# Patient Record
Sex: Male | Born: 1960 | Race: White | Hispanic: No | Marital: Single | Smoking: Current every day smoker
Health system: Southern US, Community
[De-identification: ages and names within clinical notes are randomized; demographics above are authoritative.]

## PROBLEM LIST (undated history)

## (undated) DIAGNOSIS — E0590 Thyrotoxicosis, unspecified without thyrotoxic crisis or storm: Secondary | ICD-10-CM

## (undated) DIAGNOSIS — I10 Essential (primary) hypertension: Secondary | ICD-10-CM

## (undated) DIAGNOSIS — C801 Malignant (primary) neoplasm, unspecified: Secondary | ICD-10-CM

## (undated) HISTORY — PX: ELBOW ARTHROPLASTY: SHX928

---

## 2010-05-30 ENCOUNTER — Emergency Department: Payer: Self-pay | Admitting: Emergency Medicine

## 2010-09-19 ENCOUNTER — Emergency Department (HOSPITAL_COMMUNITY)
Admission: EM | Admit: 2010-09-19 | Discharge: 2010-09-19 | Disposition: A | Discharge status: Home / Self Care | Payer: Self-pay | Attending: Emergency Medicine | Admitting: Emergency Medicine

## 2010-09-19 DIAGNOSIS — I10 Essential (primary) hypertension: Secondary | ICD-10-CM | POA: Insufficient documentation

## 2010-09-19 DIAGNOSIS — G510 Bell's palsy: Secondary | ICD-10-CM | POA: Insufficient documentation

## 2010-09-19 DIAGNOSIS — M542 Cervicalgia: Secondary | ICD-10-CM | POA: Insufficient documentation

## 2010-09-19 DIAGNOSIS — R209 Unspecified disturbances of skin sensation: Secondary | ICD-10-CM | POA: Insufficient documentation

## 2010-09-19 DIAGNOSIS — R29810 Facial weakness: Secondary | ICD-10-CM | POA: Insufficient documentation

## 2012-01-04 ENCOUNTER — Emergency Department: Payer: Self-pay | Admitting: Emergency Medicine

## 2012-01-19 ENCOUNTER — Emergency Department: Payer: Self-pay | Admitting: Emergency Medicine

## 2012-05-06 ENCOUNTER — Emergency Department: Payer: Self-pay | Admitting: Emergency Medicine

## 2012-06-06 ENCOUNTER — Emergency Department: Payer: Self-pay | Admitting: Internal Medicine

## 2012-06-17 ENCOUNTER — Emergency Department: Payer: Self-pay | Admitting: Emergency Medicine

## 2014-08-24 ENCOUNTER — Encounter: Payer: Self-pay | Admitting: Family Medicine

## 2014-08-24 ENCOUNTER — Ambulatory Visit
Admission: EM | Admit: 2014-08-24 | Discharge: 2014-08-24 | Disposition: A | Discharge status: Home / Self Care | Payer: PRIVATE HEALTH INSURANCE | Attending: Family Medicine | Admitting: Family Medicine

## 2014-08-24 DIAGNOSIS — Z024 Encounter for examination for driving license: Secondary | ICD-10-CM

## 2014-08-24 DIAGNOSIS — Z029 Encounter for administrative examinations, unspecified: Secondary | ICD-10-CM

## 2014-08-24 HISTORY — DX: Essential (primary) hypertension: I10

## 2014-08-24 LAB — DEPT OF TRANSP DIPSTICK, URINE (ARMC ONLY)
Glucose, UA: NEGATIVE mg/dL
Hgb urine dipstick: NEGATIVE
Protein, ur: NEGATIVE mg/dL
SPECIFIC GRAVITY, URINE: 1.01 (ref 1.005–1.030)

## 2014-08-24 NOTE — ED Provider Notes (Signed)
CSN: [id number]     Arrival date & time 08/24/14  0757 History   First MD Initiated Contact with Patient 08/24/14 [phone]     Chief Complaint  Patient presents with  . DOT Physical    (Consider location/radiation/quality/duration/timing/severity/associated sxs/prior Treatment) The history is provided by the patient. A language interpreter was used.    Past Medical History  Diagnosis Date  . Hypertension    Past Surgical History  Procedure Laterality Date  . Elbow arthroplasty     History reviewed. No pertinent family history. History  Substance Use Topics  . Smoking status: Current Every Day Smoker -- 0.25 packs/day    Types: Cigarettes  . Smokeless tobacco: Not on file  . Alcohol Use: 1.8 oz/week    3 Glasses of wine per week    Review of Systems  Allergies  Review of patient's allergies indicates no known allergies.  Home Medications   Prior to Admission medications   Medication Sig Start Date End Date Taking? Authorizing Provider  losartan-hydrochlorothiazide (HYZAAR) 50-12.5 MG per tablet Take 1 tablet by mouth daily.   Yes Historical Provider, MD   BP 178/98 mmHg  Pulse 60  Temp(Src) 98.4 F (36.9 C) (Oral)  Resp 16  Ht '5\' 10"'$  (1.778 m)  Wt 210 lb (95.255 kg)  BMI 30.13 kg/m2  SpO2 98% Physical Exam  Constitutional: He is oriented to person, place, and time. He appears well-developed and well-nourished.  Neck: Neck supple. No tracheal deviation present. No thyromegaly present.  Cardiovascular: Normal rate and normal heart sounds.   Pulmonary/Chest: Effort normal and breath sounds normal.  Abdominal: Soft. He exhibits no distension. There is no tenderness.  Genitourinary: Penis normal.  Musculoskeletal: Normal range of motion. He exhibits no edema.  Neurological: He is alert and oriented to person, place, and time.  Skin: Skin is warm and dry. No rash noted. No erythema.  Psychiatric: He has a normal mood and affect.   Patient's here for DOT examination.  Blood pressure is elevated at this current time. We'll recheck his blood pressure and if still elevated will return in the morning for repeat. He is on medication for his hypertension. One year certificate due to hypertension will be given once his blood pressure satisfactory.  Patient is a smoker want any stop smoking and that PFTs may need to be done at future exams. ED Course  Procedures (including critical care time) Labs Review Labs Reviewed  DEPT OF TRANSP DIPSTICK, URINE([facility])     Imaging Review No results found.   MDM   1. Driver's permit PE (physical examination)        Frederich Cha, MD 08/25/14 [phone]

## 2014-08-24 NOTE — ED Notes (Signed)
Patient is here today for DOT Physical

## 2014-08-24 NOTE — Discharge Instructions (Signed)
hbp hiSmoking Cessation Quitting smoking is important to your health and has many advantages. However, it is not always easy to quit since nicotine is a very addictive drug. Oftentimes, people try 3 times or more before being able to quit. This document explains the best ways for you to prepare to quit smoking. Quitting takes hard work and a lot of effort, but you can do it. ADVANTAGES OF QUITTING SMOKING  You will live longer, feel better, and live better.  Your body will feel the impact of quitting smoking almost immediately.  Within 20 minutes, blood pressure decreases. Your pulse returns to its normal level.  After 8 hours, carbon monoxide levels in the blood return to normal. Your oxygen level increases.  After 24 hours, the chance of having a heart attack starts to decrease. Your breath, hair, and body stop smelling like smoke.  After 48 hours, damaged nerve endings begin to recover. Your sense of taste and smell improve.  After 72 hours, the body is virtually free of nicotine. Your bronchial tubes relax and breathing becomes easier.  After 2 to 12 weeks, lungs can hold more air. Exercise becomes easier and circulation improves.  The risk of having a heart attack, stroke, cancer, or lung disease is greatly reduced.  After 1 year, the risk of coronary heart disease is cut in half.  After 5 years, the risk of stroke falls to the same as a nonsmoker.  After 10 years, the risk of lung cancer is cut in half and the risk of other cancers decreases significantly.  After 15 years, the risk of coronary heart disease drops, usually to the level of a nonsmoker.  If you are pregnant, quitting smoking will improve your chances of having a healthy baby.  The people you live with, especially any children, will be healthier.  You will have extra money to spend on things other than cigarettes. QUESTIONS TO THINK ABOUT BEFORE ATTEMPTING TO QUIT You may want to talk about your answers with  your health care provider.  Why do you want to quit?  If you tried to quit in the past, what helped and what did not?  What will be the most difficult situations for you after you quit? How will you plan to handle them?  Who can help you through the tough times? Your family? Friends? A health care provider?  What pleasures do you get from smoking? What ways can you still get pleasure if you quit? Here are some questions to ask your health care provider:  How can you help me to be successful at quitting?  What medicine do you think would be best for me and how should I take it?  What should I do if I need more help?  What is smoking withdrawal like? How can I get information on withdrawal? GET READY  Set a quit date.  Change your environment by getting rid of all cigarettes, ashtrays, matches, and lighters in your home, car, or work. Do not let people smoke in your home.  Review your past attempts to quit. Think about what worked and what did not. GET SUPPORT AND ENCOURAGEMENT You have a better chance of being successful if you have help. You can get support in many ways.  Tell your family, friends, and coworkers that you are going to quit and need their support. Ask them not to smoke around you.  Get individual, group, or telephone counseling and support. Programs are available at [employer] and health centers.  Call your local health department for information about programs in your area.  Spiritual beliefs and practices may help some smokers quit.  Download a "quit meter" on your computer to keep track of quit statistics, such as how long you have gone without smoking, cigarettes not smoked, and money saved.  Get a self-help book about quitting smoking and staying off tobacco. [place] yourself from urges to smoke. Talk to someone, go for a walk, or occupy your time with a task.  Change your normal routine. Take a different route to  work. Drink tea instead of coffee. Eat breakfast in a different place.  Reduce your stress. Take a hot bath, exercise, or read a book.  Plan something enjoyable to do every day. Reward yourself for not smoking.  Explore interactive web-based programs that specialize in helping you quit. GET MEDICINE AND USE IT CORRECTLY Medicines can help you stop smoking and decrease the urge to smoke. Combining medicine with the above behavioral methods and support can greatly increase your chances of successfully quitting smoking.  Nicotine replacement therapy helps deliver nicotine to your body without the negative effects and risks of smoking. Nicotine replacement therapy includes nicotine gum, lozenges, inhalers, nasal sprays, and skin patches. Some may be available over-the-counter and others require a prescription.  Antidepressant medicine helps people abstain from smoking, but how this works is unknown. This medicine is available by prescription.  Nicotinic receptor partial agonist medicine simulates the effect of nicotine in your brain. This medicine is available by prescription. Ask your health care provider for advice about which medicines to use and how to use them based on your health history. Your health care provider will tell you what side effects to look out for if you choose to be on a medicine or therapy. Carefully read the information on the package. Do not use any other product containing nicotine while using a nicotine replacement product.  RELAPSE OR DIFFICULT SITUATIONS Most relapses occur within the first 3 months after quitting. Do not be discouraged if you start smoking again. Remember, most people try several times before finally quitting. You may have symptoms of withdrawal because your body is used to nicotine. You may crave cigarettes, be irritable, feel very hungry, cough often, get headaches, or have difficulty concentrating. The withdrawal symptoms are only temporary. They are  strongest when you first quit, but they will go away within 10-14 days. To reduce the chances of relapse, try to:  Avoid drinking alcohol. Drinking lowers your chances of successfully quitting.  Reduce the amount of caffeine you consume. Once you quit smoking, the amount of caffeine in your body increases and can give you symptoms, such as a rapid heartbeat, sweating, and anxiety.  Avoid smokers because they can make you want to smoke.  Do not let weight gain distract you. Many smokers will gain weight when they quit, usually less than 10 pounds. Eat a healthy diet and stay active. You can always lose the weight gained after you quit.  Find ways to improve your mood other than smoking. FOR MORE INFORMATION  www.smokefree.gov  Document Released: 03/14/2001 Document Revised: 08/04/2013 Document Reviewed: 06/29/2011 [facility] Patient Information 2015 [place], [place]. This information is not intended to replace advice given to you by your health care provider. Make sure you discuss any questions you have with your health care provider.

## 2014-08-25 ENCOUNTER — Ambulatory Visit
Admission: EM | Admit: 2014-08-25 | Discharge: 2014-08-25 | Disposition: A | Discharge status: Home / Self Care | Payer: PRIVATE HEALTH INSURANCE | Attending: Family Medicine | Admitting: Family Medicine

## 2014-08-25 ENCOUNTER — Encounter: Payer: Self-pay | Admitting: Family Medicine

## 2014-08-25 DIAGNOSIS — Z029 Encounter for administrative examinations, unspecified: Secondary | ICD-10-CM

## 2014-08-25 DIAGNOSIS — Z024 Encounter for examination for driving license: Secondary | ICD-10-CM

## 2014-08-25 NOTE — Discharge Instructions (Signed)
Preventive Care for Adults A healthy lifestyle and preventive care can promote health and wellness. Preventive health guidelines for men include the following key practices:  A routine yearly physical is a good way to check with your health care provider about your health and preventative screening. It is a chance to share any concerns and updates on your health and to receive a thorough exam.  Visit your dentist for a routine exam and preventative care every 6 months. Brush your teeth twice a day and floss once a day. Good oral hygiene prevents tooth decay and gum disease.  The frequency of eye exams is based on your age, health, family medical history, use of contact lenses, and other factors. Follow your health care provider's recommendations for frequency of eye exams.  Eat a healthy diet. Foods such as vegetables, fruits, whole grains, low-fat dairy products, and lean protein foods contain the nutrients you need without too many calories. Decrease your intake of foods high in solid fats, added sugars, and salt. Eat the right amount of calories for you.Get information about a proper diet from your health care provider, if necessary.  Regular physical exercise is one of the most important things you can do for your health. Most adults should get at least 150 minutes of moderate-intensity exercise (any activity that increases your heart rate and causes you to sweat) each week. In addition, most adults need muscle-strengthening exercises on 2 or more days a week.  Maintain a healthy weight. The body mass index (BMI) is a screening tool to identify possible weight problems. It provides an estimate of body fat based on height and weight. Your health care provider can find your BMI and can help you achieve or maintain a healthy weight.For adults 20 years and older:  A BMI below 18.5 is considered underweight.  A BMI of 18.5 to 24.9 is normal.  A BMI of 25 to 29.9 is considered overweight.  A BMI  of 30 and above is considered obese.  Maintain normal blood lipids and cholesterol levels by exercising and minimizing your intake of saturated fat. Eat a balanced diet with plenty of fruit and vegetables. Blood tests for lipids and cholesterol should begin at age 76 and be repeated every 5 years. If your lipid or cholesterol levels are high, you are over 50, or you are at high risk for heart disease, you may need your cholesterol levels checked more frequently.Ongoing high lipid and cholesterol levels should be treated with medicines if diet and exercise are not working.  If you smoke, find out from your health care provider how to quit. If you do not use tobacco, do not start.  Lung cancer screening is recommended for adults aged 48-80 years who are at high risk for developing lung cancer because of a history of smoking. A yearly low-dose CT scan of the lungs is recommended for people who have at least a 30-pack-year history of smoking and are a current smoker or have quit within the past 15 years. A pack year of smoking is smoking an average of 1 pack of cigarettes a day for 1 year (for example: 1 pack a day for 30 years or 2 packs a day for 15 years). Yearly screening should continue until the smoker has stopped smoking for at least 15 years. Yearly screening should be stopped for people who develop a health problem that would prevent them from having lung cancer treatment.  If you choose to drink alcohol, do not have more than  2 drinks per day. One drink is considered to be 12 ounces (355 mL) of beer, 5 ounces (148 mL) of wine, or 1.5 ounces (44 mL) of liquor.  Avoid use of street drugs. Do not share needles with anyone. Ask for help if you need support or instructions about stopping the use of drugs.  High blood pressure causes heart disease and increases the risk of stroke. Your blood pressure should be checked at least every 1-2 years. Ongoing high blood pressure should be treated with  medicines, if weight loss and exercise are not effective.  If you are 45-79 years old, ask your health care provider if you should take aspirin to prevent heart disease.  Diabetes screening involves taking a blood sample to check your fasting blood sugar level. This should be done once every 3 years, after age 45, if you are within normal weight and without risk factors for diabetes. Testing should be considered at a younger age or be carried out more frequently if you are overweight and have at least 1 risk factor for diabetes.  Colorectal cancer can be detected and often prevented. Most routine colorectal cancer screening begins at the age of 50 and continues through age 75. However, your health care provider may recommend screening at an earlier age if you have risk factors for colon cancer. On a yearly basis, your health care provider may provide home test kits to check for hidden blood in the stool. Use of a small camera at the end of a tube to directly examine the colon (sigmoidoscopy or colonoscopy) can detect the earliest forms of colorectal cancer. Talk to your health care provider about this at age 50, when routine screening begins. Direct exam of the colon should be repeated every 5-10 years through age 75, unless early forms of precancerous polyps or small growths are found.  People who are at an increased risk for hepatitis B should be screened for this virus. You are considered at high risk for hepatitis B if:  You were born in a country where hepatitis B occurs often. Talk with your health care provider about which countries are considered high risk.  Your parents were born in a high-risk country and you have not received a shot to protect against hepatitis B (hepatitis B vaccine).  You have HIV or AIDS.  You use needles to inject street drugs.  You live with, or have sex with, someone who has hepatitis B.  You are a man who has sex with other men (MSM).  You get hemodialysis  treatment.  You take certain medicines for conditions such as cancer, organ transplantation, and autoimmune conditions.  Hepatitis C blood testing is recommended for all people born from 1945 through 1965 and any individual with known risks for hepatitis C.  Practice safe sex. Use condoms and avoid high-risk sexual practices to reduce the spread of sexually transmitted infections (STIs). STIs include gonorrhea, chlamydia, syphilis, trichomonas, herpes, HPV, and human immunodeficiency virus (HIV). Herpes, HIV, and HPV are viral illnesses that have no cure. They can result in disability, cancer, and death.  If you are at risk of being infected with HIV, it is recommended that you take a prescription medicine daily to prevent HIV infection. This is called preexposure prophylaxis (PrEP). You are considered at risk if:  You are a man who has sex with other men (MSM) and have other risk factors.  You are a heterosexual man, are sexually active, and are at increased risk for HIV infection.    infection.  You take drugs by injection.  You are sexually active with a partner who has HIV.  Talk with your health care provider about whether you are at high risk of being infected with HIV. If you choose to begin PrEP, you should first be tested for HIV. You should then be tested every 3 months for as long as you are taking PrEP.  A one-time screening for abdominal aortic aneurysm (AAA) and surgical repair of large AAAs by ultrasound are recommended for men ages 72 to 53 years who are current or former smokers.  Healthy men should no longer receive prostate-specific antigen (PSA) blood tests as part of routine cancer screening. Talk with your health care provider about prostate cancer screening.  Testicular cancer screening is not recommended for adult males who have no symptoms. Screening includes self-exam, a health care provider exam, and other screening tests. Consult with your health care provider about any symptoms  you have or any concerns you have about testicular cancer.  Use sunscreen. Apply sunscreen liberally and repeatedly throughout the day. You should seek shade when your shadow is shorter than you. Protect yourself by wearing long sleeves, pants, a wide-brimmed hat, and sunglasses year round, whenever you are outdoors.  Once a month, do a whole-body skin exam, using a mirror to look at the skin on your back. Tell your health care provider about new moles, moles that have irregular borders, moles that are larger than a pencil eraser, or moles that have changed in shape or color.  Stay current with required vaccines (immunizations).  Influenza vaccine. All adults should be immunized every year.  Tetanus, diphtheria, and acellular pertussis (Td, Tdap) vaccine. An adult who has not previously received Tdap or who does not know his vaccine status should receive 1 dose of Tdap. This initial dose should be followed by tetanus and diphtheria toxoids (Td) booster doses every 10 years. Adults with an unknown or incomplete history of completing a 3-dose immunization series with Td-containing vaccines should begin or complete a primary immunization series including a Tdap dose. Adults should receive a Td booster every 10 years.  Varicella vaccine. An adult without evidence of immunity to varicella should receive 2 doses or a second dose if he has previously received 1 dose.  Human papillomavirus (HPV) vaccine. Males aged 54-21 years who have not received the vaccine previously should receive the 3-dose series. Males aged 22-26 years may be immunized. Immunization is recommended through the age of 50 years for any male who has sex with males and did not get any or all doses earlier. Immunization is recommended for any person with an immunocompromised condition through the age of 59 years if he did not get any or all doses earlier. During the 3-dose series, the second dose should be obtained 4-8 weeks after the first  dose. The third dose should be obtained 24 weeks after the first dose and 16 weeks after the second dose.  Zoster vaccine. One dose is recommended for adults aged 58 years or older unless certain conditions are present.  Measles, mumps, and rubella (MMR) vaccine. Adults born before 75 generally are considered immune to measles and mumps. Adults born in [birth year] or later should have 1 or more doses of MMR vaccine unless there is a contraindication to the vaccine or there is laboratory evidence of immunity to each of the three diseases. A routine second dose of MMR vaccine should be obtained at least 28 days after the first dose for students  attending postsecondary schools, health care workers, or international travelers. People who received inactivated measles vaccine or an unknown type of measles vaccine during 1963-1967 should receive 2 doses of MMR vaccine. People who received inactivated mumps vaccine or an unknown type of mumps vaccine before 1979 and are at high risk for mumps infection should consider immunization with 2 doses of MMR vaccine. Unvaccinated health care workers born before [birth year] who lack laboratory evidence of measles, mumps, or rubella immunity or laboratory confirmation of disease should consider measles and mumps immunization with 2 doses of MMR vaccine or rubella immunization with 1 dose of MMR vaccine.  Pneumococcal 13-valent conjugate (PCV13) vaccine. When indicated, a person who is uncertain of his immunization history and has no record of immunization should receive the PCV13 vaccine. An adult aged 15 years or older who has certain medical conditions and has not been previously immunized should receive 1 dose of PCV13 vaccine. This PCV13 should be followed with a dose of pneumococcal polysaccharide (PPSV23) vaccine. The PPSV23 vaccine dose should be obtained at least 8 weeks after the dose of PCV13 vaccine. An adult aged 56 years or older who has certain medical conditions and  previously received 1 or more doses of PPSV23 vaccine should receive 1 dose of PCV13. The PCV13 vaccine dose should be obtained 1 or more years after the last PPSV23 vaccine dose.  Pneumococcal polysaccharide (PPSV23) vaccine. When PCV13 is also indicated, PCV13 should be obtained first. All adults aged 37 years and older should be immunized. An adult younger than age 32 years who has certain medical conditions should be immunized. Any person who resides in a nursing home or long-term care facility should be immunized. An adult smoker should be immunized. People with an immunocompromised condition and certain other conditions should receive both PCV13 and PPSV23 vaccines. People with human immunodeficiency virus (HIV) infection should be immunized as soon as possible after diagnosis. Immunization during chemotherapy or radiation therapy should be avoided. Routine use of PPSV23 vaccine is not recommended for American Indians, [place]atives, or people younger than 65 years unless there are medical conditions that require PPSV23 vaccine. When indicated, people who have unknown immunization and have no record of immunization should receive PPSV23 vaccine. One-time revaccination 5 years after the first dose of PPSV23 is recommended for people aged 19-64 years who have chronic kidney failure, nephrotic syndrome, asplenia, or immunocompromised conditions. People who received 1-2 doses of PPSV23 before age 2 years should receive another dose of PPSV23 vaccine at age 18 years or later if at least 5 years have passed since the previous dose. Doses of PPSV23 are not needed for people immunized with PPSV23 at or after age 29 years.  Meningococcal vaccine. Adults with asplenia or persistent complement component deficiencies should receive 2 doses of quadrivalent meningococcal conjugate (MenACWY-D) vaccine. The doses should be obtained at least 2 months apart. Microbiologists working with certain meningococcal bacteria,  [employer] recruits, people at risk during an outbreak, and people who travel to or live in countries with a high rate of meningitis should be immunized. A first-year college student up through age 15 years who is living in a residence hall should receive a dose if he did not receive a dose on or after his 16th birthday. Adults who have certain high-risk conditions should receive one or more doses of vaccine.  Hepatitis A vaccine. Adults who wish to be protected from this disease, have certain high-risk conditions, work with hepatitis A-infected animals, work in hepatitis A research  travel to or work in countries with a high rate of hepatitis A should be immunized. Adults who were previously unvaccinated and who anticipate close contact with an international adoptee during the first 60 days after arrival in the [place] from a country with a high rate of hepatitis A should be immunized.  Hepatitis B vaccine. Adults should be immunized if they wish to be protected from this disease, have certain high-risk conditions, may be exposed to blood or other infectious body fluids, are household contacts or sex partners of hepatitis B positive people, are clients or workers in certain care facilities, or travel to or work in countries with a high rate of hepatitis B.  Haemophilus influenzae type b (Hib) vaccine. A previously unvaccinated person with asplenia or sickle cell disease or having a scheduled splenectomy should receive 1 dose of Hib vaccine. Regardless of previous immunization, a recipient of a hematopoietic stem cell transplant should receive a 3-dose series 6-12 months after his successful transplant. Hib vaccine is not recommended for adults with HIV infection. Preventive Service / Frequency Ages 81 to 7  Blood pressure check.** / Every 1 to 2 years.  Lipid and cholesterol check.** / Every 5 years beginning at age 49.  Hepatitis C blood test.** / For any individual with known risks for  hepatitis C.  Skin self-exam. / Monthly.  Influenza vaccine. / Every year.  Tetanus, diphtheria, and acellular pertussis (Tdap, Td) vaccine.** / Consult your health care provider. 1 dose of Td every 10 years.  Varicella vaccine.** / Consult your health care provider.  HPV vaccine. / 3 doses over 6 months, if 67 or younger.  Measles, mumps, rubella (MMR) vaccine.** / You need at least 1 dose of MMR if you were born in 1957 or later. You may also need a second dose.  Pneumococcal 13-valent conjugate (PCV13) vaccine.** / Consult your health care provider.  Pneumococcal polysaccharide (PPSV23) vaccine.** / 1 to 2 doses if you smoke cigarettes or if you have certain conditions.  Meningococcal vaccine.** / 1 dose if you are age 28 to 74 years and a Market researcher living in a residence hall, or have one of several medical conditions. You may also need additional booster doses.  Hepatitis A vaccine.** / Consult your health care provider.  Hepatitis B vaccine.** / Consult your health care provider.  Haemophilus influenzae type b (Hib) vaccine.** / Consult your health care provider. Ages 35 to 13  Blood pressure check.** / Every 1 to 2 years.  Lipid and cholesterol check.** / Every 5 years beginning at age 12.  Lung cancer screening. / Every year if you are aged 35-80 years and have a 30-pack-year history of smoking and currently smoke or have quit within the past 15 years. Yearly screening is stopped once you have quit smoking for at least 15 years or develop a health problem that would prevent you from having lung cancer treatment.  Fecal occult blood test (FOBT) of stool. / Every year beginning at age 60 and continuing until age 42. You may not have to do this test if you get a colonoscopy every 10 years.  Flexible sigmoidoscopy** or colonoscopy.** / Every 5 years for a flexible sigmoidoscopy or every 10 years for a colonoscopy beginning at age 83 and continuing until age  50.  Hepatitis C blood test.** / For all people born from [birth year] through 1965 and any individual with known risks for hepatitis C.  Skin self-exam. / Monthly.  Influenza vaccine. / Every  year.  Tetanus, diphtheria, and acellular pertussis (Tdap/Td) vaccine.** / Consult your health care provider. 1 dose of Td every 10 years.  Varicella vaccine.** / Consult your health care provider.  Zoster vaccine.** / 1 dose for adults aged 60 years or older.  Measles, mumps, rubella (MMR) vaccine.** / You need at least 1 dose of MMR if you were born in 1957 or later. You may also need a second dose.  Pneumococcal 13-valent conjugate (PCV13) vaccine.** / Consult your health care provider.  Pneumococcal polysaccharide (PPSV23) vaccine.** / 1 to 2 doses if you smoke cigarettes or if you have certain conditions.  Meningococcal vaccine.** / Consult your health care provider.  Hepatitis A vaccine.** / Consult your health care provider.  Hepatitis B vaccine.** / Consult your health care provider.  Haemophilus influenzae type b (Hib) vaccine.** / Consult your health care provider. Ages 65 and over  Blood pressure check.** / Every 1 to 2 years.  Lipid and cholesterol check.**/ Every 5 years beginning at age 20.  Lung cancer screening. / Every year if you are aged 55-80 years and have a 30-pack-year history of smoking and currently smoke or have quit within the past 15 years. Yearly screening is stopped once you have quit smoking for at least 15 years or develop a health problem that would prevent you from having lung cancer treatment.  Fecal occult blood test (FOBT) of stool. / Every year beginning at age 50 and continuing until age 75. You may not have to do this test if you get a colonoscopy every 10 years.  Flexible sigmoidoscopy** or colonoscopy.** / Every 5 years for a flexible sigmoidoscopy or every 10 years for a colonoscopy beginning at age 50 and continuing until age 75.  Hepatitis C blood  test.** / For all people born from 1945 through 1965 and any individual with known risks for hepatitis C.  Abdominal aortic aneurysm (AAA) screening.** / A one-time screening for ages 65 to 75 years who are current or former smokers.  Skin self-exam. / Monthly.  Influenza vaccine. / Every year.  Tetanus, diphtheria, and acellular pertussis (Tdap/Td) vaccine.** / 1 dose of Td every 10 years.  Varicella vaccine.** / Consult your health care provider.  Zoster vaccine.** / 1 dose for adults aged 60 years or older.  Pneumococcal 13-valent conjugate (PCV13) vaccine.** / Consult your health care provider.  Pneumococcal polysaccharide (PPSV23) vaccine.** / 1 dose for all adults aged 65 years and older.  Meningococcal vaccine.** / Consult your health care provider.  Hepatitis A vaccine.** / Consult your health care provider.  Hepatitis B vaccine.** / Consult your health care provider.  Haemophilus influenzae type b (Hib) vaccine.** / Consult your health care provider. **Family history and personal history of risk and conditions may change your health care provider's recommendations. Document Released: 05/16/2001 Document Revised: 03/25/2013 Document Reviewed: 08/15/2010 Exit[employer] Patient Information 2015 [employer]. This information is not intended to replace advice given to you by your health care provider. Make sure you discuss any questions you have with your health care provider.  

## 2014-08-25 NOTE — ED Provider Notes (Addendum)
CSN: [id number]     Arrival date & time 08/25/14  0911 History   First MD Initiated Contact with Patient 08/25/14 [phone]     Chief Complaint  Patient presents with  . Follow-up   (Consider location/radiation/quality/duration/timing/severity/associated sxs/prior Treatment) HPI  Past Medical History  Diagnosis Date  . Hypertension    Past Surgical History  Procedure Laterality Date  . Elbow arthroplasty     History reviewed. No pertinent family history. History  Substance Use Topics  . Smoking status: Current Every Day Smoker -- 0.25 packs/day    Types: Cigarettes  . Smokeless tobacco: Not on file  . 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 Blood pressure 138/88. Patient given a year for his DOT  Review of Systems  Allergies  Review of patient's allergies indicates no known allergies.  Home Medications   Prior to Admission medications   Medication Sig Start Date End Date Taking? Authorizing Provider  losartan-hydrochlorothiazide (HYZAAR) 50-12.5 MG per tablet Take 1 tablet by mouth daily.    Historical Provider, MD   BP 138/88 mmHg  Pulse 57  Temp(Src) 98.2 F (36.8 C) (Oral)  Resp 16  Ht '5\' 11"'$  (1.803 m)  Wt 210 lb (95.255 kg)  BMI 29.30 kg/m2  SpO2 98% Physical Exam  ED Course  Procedures (including critical care time) Labs Review Labs Reviewed - No data to display  Imaging Review No results found.   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

## 2014-08-25 NOTE — ED Notes (Signed)
Patient is here today for a BP recheck to complete the DOT physical.

## 2015-06-04 ENCOUNTER — Emergency Department: Payer: Medicaid Other

## 2015-06-04 ENCOUNTER — Emergency Department
Admission: EM | Admit: 2015-06-04 | Discharge: 2015-06-04 | Disposition: A | Discharge status: Home / Self Care | Payer: Medicaid Other | Attending: Emergency Medicine | Admitting: Emergency Medicine

## 2015-06-04 DIAGNOSIS — M25552 Pain in left hip: Secondary | ICD-10-CM | POA: Diagnosis present

## 2015-06-04 DIAGNOSIS — R918 Other nonspecific abnormal finding of lung field: Secondary | ICD-10-CM | POA: Diagnosis not present

## 2015-06-04 DIAGNOSIS — I10 Essential (primary) hypertension: Secondary | ICD-10-CM | POA: Insufficient documentation

## 2015-06-04 DIAGNOSIS — M899 Disorder of bone, unspecified: Secondary | ICD-10-CM | POA: Diagnosis not present

## 2015-06-04 DIAGNOSIS — E119 Type 2 diabetes mellitus without complications: Secondary | ICD-10-CM | POA: Diagnosis not present

## 2015-06-04 DIAGNOSIS — C414 Malignant neoplasm of pelvic bones, sacrum and coccyx: Secondary | ICD-10-CM | POA: Insufficient documentation

## 2015-06-04 LAB — COMPREHENSIVE METABOLIC PANEL
ALK PHOS: 69 U/L (ref 38–126)
ALT: 24 U/L (ref 17–63)
AST: 18 U/L (ref 15–41)
Albumin: 3.6 g/dL (ref 3.5–5.0)
Anion gap: 7 (ref 5–15)
BUN: 14 mg/dL (ref 6–20)
CHLORIDE: 106 mmol/L (ref 101–111)
CO2: 27 mmol/L (ref 22–32)
CREATININE: 0.77 mg/dL (ref 0.61–1.24)
Calcium: 9.3 mg/dL (ref 8.9–10.3)
GFR calc non Af Amer: >60 mL/min (ref >60)
GLUCOSE: 94 mg/dL (ref 65–99)
Potassium: 4.3 mmol/L (ref 3.5–5.1)
SODIUM: 140 mmol/L (ref 135–145)
Total Bilirubin: 0.6 mg/dL (ref 0.3–1.2)
Total Protein: 7.3 g/dL (ref 6.5–8.1)

## 2015-06-04 LAB — CBC WITH DIFFERENTIAL/PLATELET
BASOS ABS: 0.1 10*3/uL (ref 0–0.1)
BASOS PCT: 1 %
EOS ABS: 0.2 10*3/uL (ref 0–0.7)
EOS PCT: 2 %
HCT: 47.4 % (ref 40.0–52.0)
Hemoglobin: 16.3 g/dL (ref 13.0–18.0)
Lymphocytes Relative: 32 %
Lymphs Abs: 3.4 10*3/uL (ref 1.0–3.6)
MCH: 29.5 pg (ref 26.0–34.0)
MCHC: 34.5 g/dL (ref 32.0–36.0)
MCV: 85.6 fL (ref 80.0–100.0)
MONO ABS: 0.8 10*3/uL (ref 0.2–1.0)
Monocytes Relative: 8 %
Neutro Abs: 6.2 10*3/uL (ref 1.4–6.5)
Neutrophils Relative %: 57 %
PLATELETS: 263 10*3/uL (ref 150–440)
RBC: 5.54 MIL/uL (ref 4.40–5.90)
RDW: 13 % (ref 11.5–14.5)
WBC: 10.7 10*3/uL — AB (ref 3.8–10.6)

## 2015-06-04 MED ORDER — DIAZEPAM 5 MG PO TABS
ORAL_TABLET | ORAL | Status: AC
  Administered 2015-06-04: 5 mg via ORAL
  Filled 2015-06-04: qty 1

## 2015-06-04 MED ORDER — OXYCODONE-ACETAMINOPHEN 7.5-325 MG PO TABS: 1.0000 | ORAL_TABLET | ORAL | Status: DC | PRN

## 2015-06-04 MED ORDER — OXYCODONE-ACETAMINOPHEN 5-325 MG PO TABS
2.0000 | ORAL_TABLET | Freq: Once | ORAL | Status: AC
  Administered 2015-06-04: 1 via ORAL
  Filled 2015-06-04: qty 1

## 2015-06-04 MED ORDER — DIAZEPAM 5 MG PO TABS
5.0000 mg | ORAL_TABLET | Freq: Once | ORAL | Status: AC
  Administered 2015-06-04: 5 mg via ORAL

## 2015-06-04 NOTE — ED Notes (Signed)
Pt sent referred to the ED from [facility] for abnormal left hip xray.. Pt states he has been having pain in the hip for abotu 3 months.

## 2015-06-04 NOTE — ED Provider Notes (Addendum)
[facility] Emergency Department Provider Note     Time seen: ----------------------------------------- 11:39 AM on 06/04/2015 -----------------------------------------    I have reviewed the triage vital signs and the nursing notes.   HISTORY  Chief Complaint Hip Pain    HPI Chris Mason is a 55 y.o. male who presents to ER for left hip pain for the past 2 months. Patient states initially started in his inguinal area and then more laterally on the hip. Patient states recently he's had pain in his hamstring and up into his left buttock. Patient had outpatient x-rays yesterday that showed abnormal and possibly fractured left hip. Patient states he woke this morning taking NSAIDs and feeling completely better. He was able to walk and move about normally.   Past Medical History  Diagnosis Date  . Hypertension     There are no active problems to display for this patient.   Past Surgical History  Procedure Laterality Date  . Elbow arthroplasty      Allergies Review of patient's allergies indicates no known allergies.  Social History Social History  Substance Use Topics  . Smoking status: Current Every Day Smoker -- 0.25 packs/day    Types: Cigarettes  . Smokeless tobacco: None  . Alcohol Use: 1.8 oz/week    3 Glasses of wine per week    Review of Systems Constitutional: Negative for fever. Eyes: Negative for visual changes. ENT: Negative for sore throat. Cardiovascular: Negative for chest pain. Respiratory: Negative for shortness of breath. Gastrointestinal: Negative for abdominal pain, vomiting and diarrhea. Genitourinary: Negative for dysuria. Musculoskeletal: Positive for left hip pain Skin: Negative for rash. Neurological: Negative for headaches, focal weakness or numbness.  10-point ROS otherwise negative.  ____________________________________________   PHYSICAL EXAM:  VITAL SIGNS: ED Triage Vitals  Enc Vitals Group      BP 06/04/15 1107 151/84 mmHg     Pulse Rate 06/04/15 1107 72     Resp 06/04/15 1107 18     Temp 06/04/15 1107 98.3 F (36.8 C)     Temp Source 06/04/15 1107 Oral     SpO2 06/04/15 1107 100 %     Weight 06/04/15 1107 220 lb (99.791 kg)     Height 06/04/15 1107 '5\' 11"'$  (1.803 m)     Head Cir --      Peak Flow --      Pain Score 06/04/15 1107 5     Pain Loc --      Pain Edu? --      Excl. in [place]? --     Constitutional: Alert and oriented. Well appearing and in no distress. Eyes: Conjunctivae are normal. PERRL. Normal extraocular movements. ENT   Head: Normocephalic and atraumatic.   Nose: No congestion/rhinnorhea.   Mouth/Throat: Mucous membranes are moist.   Neck: No stridor. Cardiovascular: Normal rate, regular rhythm. Normal and symmetric distal pulses are present in all extremities. No murmurs, rubs, or gallops. Respiratory: Normal respiratory effort without tachypnea nor retractions. Breath sounds are clear and equal bilaterally. No wheezes/rales/rhonchi. Gastrointestinal: Soft and nontender. No distention. No abdominal bruits.  Musculoskeletal: Nontender with normal range of motion in all extremities. No joint effusions.  No lower extremity tenderness nor edema. Neurologic:  Normal speech and language. No gross focal neurologic deficits are appreciated. Speech is normal. No gait instability. Skin:  Skin is warm, dry and intact. No rash noted. Psychiatric: Mood and affect are normal. Speech and behavior are normal. Patient exhibits appropriate insight and judgment. ____________________________________________  ED  COURSE:  Pertinent labs & imaging results that were available during my care of the patient were reviewed by me and considered in my medical decision making (see chart for details). Patient is in no acute distress, will obtain CT imaging of the left hip. ____________________________________________   Labs Reviewed  CBC WITH DIFFERENTIAL/PLATELET -  Abnormal; Notable for the following:    WBC 10.7 (*)    All other components within normal limits  COMPREHENSIVE METABOLIC PANEL  PROTEIN ELECTROPHORESIS, SERUM  IGG, IGA, IGM  KAPPA/LAMBDA LIGHT CHAINS     RADIOLOGY Images were viewed by me  CT of the left hip IMPRESSION: Large destructive lesion in the left parasymphyseal pubic bone and superior pubic ramus is consistent with metastatic disease or multiple myeloma. Primary lesion is not identified. It is possible there are additional smaller lesions in the left greater trochanter and left femoral head.  IMPRESSION: Macro lobulated RIGHT upper lobe nodular density question pulmonary mass/tumor; CT chest with contrast recommended to evaluate. ____________________________________________  FINAL ASSESSMENT AND PLAN  Left hip pain, destructive bony lesion, lung mass  Plan: Patient with imaging as dictated above. Hip pain was initially felt to be musculoskeletal, distracted lesion was seen on CT of the hip. I have discussed with Dr.Corcoran from oncology who recommended a series of tests which I ordered for him as well as chest x-ray. Will be contacted with those test results and an appointment will be made for him in the next week.   Earleen Newport, MD   Earleen Newport, MD 06/04/15 [place], MD 06/04/15 [phone]

## 2015-06-04 NOTE — ED Notes (Signed)
Pt states that he is getting frustrated and anxious. He was called and told to "drop everything and get to the ED" and he is stressed. Requests something for anxiety. Told pt this nurse will discuss with MD and see what we can do to help.

## 2015-06-04 NOTE — Discharge Instructions (Signed)
Hip Pain Your hip is the joint between your upper legs and your lower pelvis. The bones, cartilage, tendons, and muscles of your hip joint perform a lot of work each day supporting your body weight and allowing you to move around. Hip pain can range from a minor ache to severe pain in one or both of your hips. Pain may be felt on the inside of the hip joint near the groin, or the outside near the buttocks and upper thigh. You may have swelling or stiffness as well.  HOME CARE INSTRUCTIONS   Take medicines only as directed by your health care provider.  Apply ice to the injured area:  Put ice in a plastic bag.  Place a towel between your skin and the bag.  Leave the ice on for 15-20 minutes at a time, 3-4 times a day.  Keep your leg raised (elevated) when possible to lessen swelling.  Avoid activities that cause pain.  Follow specific exercises as directed by your health care provider.  Sleep with a pillow between your legs on your most comfortable side.  Record how often you have hip pain, the location of the pain, and what it feels like. SEEK MEDICAL CARE IF:   You are unable to put weight on your leg.  Your hip is red or swollen or very tender to touch.  Your pain or swelling continues or worsens after 1 week.  You have increasing difficulty walking.  You have a fever. SEEK IMMEDIATE MEDICAL CARE IF:   You have fallen.  You have a sudden increase in pain and swelling in your hip. MAKE SURE YOU:   Understand these instructions.  Will watch your condition.  Will get help right away if you are not doing well or get worse.   This information is not intended to replace advice given to you by your health care provider. Make sure you discuss any questions you have with your health care provider.   Document Released: 09/07/2009 Document Revised: 04/10/2014 Document Reviewed: 11/14/2012 Elsevier Interactive Patient Education 2016 Elsevier [employer].  Musculoskeletal  Pain Musculoskeletal pain is muscle and boney aches and pains. These pains can occur in any part of the body. Your caregiver may treat you without knowing the cause of the pain. They may treat you if blood or urine tests, X-rays, and other tests were normal.  CAUSES There is often not a definite cause or reason for these pains. These pains may be caused by a type of germ (virus). The discomfort may also come from overuse. Overuse includes working out too hard when your body is not fit. Boney aches also come from weather changes. Bone is sensitive to atmospheric pressure changes. HOME CARE INSTRUCTIONS   Ask when your test results will be ready. Make sure you get your test results.  Only take over-the-counter or prescription medicines for pain, discomfort, or fever as directed by your caregiver. If you were given medications for your condition, do not drive, operate machinery or power tools, or sign legal documents for 24 hours. Do not drink alcohol. Do not take sleeping pills or other medications that may interfere with treatment.  Continue all activities unless the activities cause more pain. When the pain lessens, slowly resume normal activities. Gradually increase the intensity and duration of the activities or exercise.  During periods of severe pain, bed rest may be helpful. Lay or sit in any position that is comfortable.  Putting ice on the injured area.  Put ice in a  bag.  Place a towel between your skin and the bag.  Leave the ice on for 15 to 20 minutes, 3 to 4 times a day.  Follow up with your caregiver for continued problems and no reason can be found for the pain. If the pain becomes worse or does not go away, it may be necessary to repeat tests or do additional testing. Your caregiver may need to look further for a possible cause. SEEK IMMEDIATE MEDICAL CARE IF:  You have pain that is getting worse and is not relieved by medications.  You develop chest pain that is associated  with shortness or breath, sweating, feeling sick to your stomach (nauseous), or throw up (vomit).  Your pain becomes localized to the abdomen.  You develop any new symptoms that seem different or that concern you. MAKE SURE YOU:   Understand these instructions.  Will watch your condition.  Will get help right away if you are not doing well or get worse.   This information is not intended to replace advice given to you by your health care provider. Make sure you discuss any questions you have with your health care provider.   Document Released: 03/20/2005 Document Revised: 06/12/2011 Document Reviewed: 11/22/2012 Elsevier Interactive Patient Education [employer].

## 2015-06-04 NOTE — ED Notes (Signed)
Pt discharged home after verbalizing understanding of discharge instructions; nad noted. 

## 2015-06-07 ENCOUNTER — Inpatient Hospital Stay: Payer: Medicaid Other | Attending: Hematology and Oncology | Admitting: Hematology and Oncology

## 2015-06-07 VITALS — BP 165/106 | HR 87 | Temp 99.4°F | Resp 18 | Ht 71.0 in

## 2015-06-07 DIAGNOSIS — C3411 Malignant neoplasm of upper lobe, right bronchus or lung: Secondary | ICD-10-CM | POA: Insufficient documentation

## 2015-06-07 DIAGNOSIS — C414 Malignant neoplasm of pelvic bones, sacrum and coccyx: Secondary | ICD-10-CM

## 2015-06-07 DIAGNOSIS — I10 Essential (primary) hypertension: Secondary | ICD-10-CM | POA: Insufficient documentation

## 2015-06-07 DIAGNOSIS — R918 Other nonspecific abnormal finding of lung field: Secondary | ICD-10-CM | POA: Insufficient documentation

## 2015-06-07 DIAGNOSIS — Z79899 Other long term (current) drug therapy: Secondary | ICD-10-CM | POA: Diagnosis not present

## 2015-06-07 DIAGNOSIS — F17210 Nicotine dependence, cigarettes, uncomplicated: Secondary | ICD-10-CM | POA: Insufficient documentation

## 2015-06-07 DIAGNOSIS — F419 Anxiety disorder, unspecified: Secondary | ICD-10-CM | POA: Insufficient documentation

## 2015-06-07 DIAGNOSIS — C787 Secondary malignant neoplasm of liver and intrahepatic bile duct: Secondary | ICD-10-CM | POA: Insufficient documentation

## 2015-06-07 DIAGNOSIS — M25552 Pain in left hip: Secondary | ICD-10-CM | POA: Insufficient documentation

## 2015-06-07 DIAGNOSIS — Z5111 Encounter for antineoplastic chemotherapy: Secondary | ICD-10-CM | POA: Insufficient documentation

## 2015-06-07 DIAGNOSIS — C7951 Secondary malignant neoplasm of bone: Secondary | ICD-10-CM | POA: Insufficient documentation

## 2015-06-07 LAB — KAPPA/LAMBDA LIGHT CHAINS
Kappa free light chain: 19.78 mg/L — ABNORMAL HIGH (ref 3.30–19.40)
Kappa, lambda light chain ratio: 1.07 (ref 0.26–1.65)
Lambda free light chains: 18.43 mg/L (ref 5.71–26.30)

## 2015-06-07 LAB — PROTEIN ELECTROPHORESIS, SERUM
A/G RATIO SPE: 1.1 (ref 0.7–1.7)
Albumin ELP: 3.9 g/dL (ref 2.9–4.4)
Alpha-1-Globulin: 0.3 g/dL (ref 0.0–0.4)
Alpha-2-Globulin: 0.9 g/dL (ref 0.4–1.0)
BETA GLOBULIN: 1.2 g/dL (ref 0.7–1.3)
GLOBULIN, TOTAL: 3.5 g/dL (ref 2.2–3.9)
Gamma Globulin: 1 g/dL (ref 0.4–1.8)
Total Protein ELP: 7.4 g/dL (ref 6.0–8.5)

## 2015-06-07 LAB — IGG, IGA, IGM
IGM, SERUM: 94 mg/dL (ref 20–172)
IgA: 355 mg/dL (ref 90–386)
IgG (Immunoglobin G), Serum: 923 mg/dL (ref 700–1600)

## 2015-06-07 NOTE — Progress Notes (Signed)
[facility] day:  06/07/2015  Chief Complaint: JAKHAI FANT is a 55 y.o. male with a right lung mass and lytic lesion in the pubic bone who is referred by Dr. Lenise Arena for assessment and management.  HPI:   The patient has a 33 pack year smoking history.  He has a history of pneumonia x 2.  The last episode was 3 years ago.  He presented to [facility] on 06/04/2015 with 2 month history of left hip pain.  Pain initially started in the inguinal area and then moved more laterally on the hip.  Plain films of the left hip at the [facility] on 06/03/2015 revealed an abnormality.  CT scan without contrast of the left hip on 06/04/2015 revealed a 3.3 x 2.5 cm destructive lesion in the left parasymphyseal pubic bone and superior pubic ramus.  Surrounding sift tissues demonstrated a low-attenuation soft tissue component of the lesion.  There was subtle lucency of the left greater trochanter and a small focus of cortical destruction along the anterior aspect of the greater trochanter.  There was also lucency in the periphery of the left femoral head without cortical disruption.  CXR on 06/04/2015 revealed a 3 x 1.8 x 2.3 cm nodular density in the right upper lobe.  There were no acute osseous findings.  Labs on 06/04/2015 revealed a a hematocrit of 47.4, hemoglobin 16.3, MCV 85.6, platelets 263,000, WBC 10,700 with an ANC of 6200.  Comprehensive metabolic panel revealed a normal creatinine, calcium, protein, and albumen.  SPEP was normal.  Free light chains and antibody levels are pending.  Symptomatically, he feels good except for left hip pain.  He denies any respiratory symptoms.   Past Medical History  Diagnosis Date  . Hypertension     Past Surgical History  Procedure Laterality Date  . Elbow arthroplasty      No family history on file.  Social History:  reports that he has been smoking Cigarettes.  He has been smoking about 0.25 packs  per day. He does not have any smokeless tobacco history on file. He reports that he drinks about 1.8 oz of alcohol per week. He reports that he does not use illicit drugs.  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  The patient is accompanied by his girlfriend today.  Allergies: No Known Allergies  Current Medications: Current Outpatient Prescriptions  Medication Sig Dispense Refill  . losartan-hydrochlorothiazide (HYZAAR) 50-12.5 MG per tablet Take 1 tablet by mouth daily.    Marland Kitchen oxyCODONE-acetaminophen (PERCOCET) 7.5-325 MG tablet Take 1 tablet by mouth every 4 (four) hours as needed for severe pain. 20 tablet 0   No current facility-administered medications for this visit.   Review of Systems:  GENERAL:  Feels healthy.  Active.  No fevers, sweats or weight loss. PERFORMANCE STATUS (ECOG):  1 HEENT:  Transient ear and teeth pain x 2 4 months ago.  No visual changes, runny nose, sore throat, mouth sores or tenderness. Lungs: Little cough in the morning, sometimes at night.  No shortness of breath or cough.  No hemoptysis. Cardiac:  No chest pain, palpitations, orthopnea, or PND. GI:  No nausea, vomiting, diarrhea, constipation, melena or hematochezia.  No prior colonoscopy. GU:  No urgency, frequency, dysuria, or hematuria. Musculoskeletal:  Left hip pain.  Transient left rib pain.  No back pain.  No joint pain.  No muscle tenderness.  Extremities:  No pain or swelling. Skin:  Small lesion on right arm, busted.  Transient circular whelp on back.  No rashes or skin changes. Neuro:  Few headaches.  No numbness or weakness, balance or coordination issues. Endocrine:  No diabetes, thyroid issues, hot flashes or night sweats. Psych:  Anxiety.  No mood changes or depression. Pain:  No focal pain. Review of systems:  All other systems reviewed and found to be negative.  Physical Exam: Blood pressure 165/106, pulse 87,  temperature 99.4 F (37.4 C), temperature source Tympanic, resp. rate 18, height '5\' 11"'$  (1.803 m). GENERAL:  Well developed, well nourished, sitting comfortably in the exam room in no acute distress. MENTAL STATUS:  Alert and oriented to person, place and time. HEAD:  Short brown hair with graying goatee.  Normocephalic, atraumatic, face symmetric, no Cushingoid features. EYES: Pupils equal round and reactive to light and accomodation.  No conjunctivitis or scleral icterus. ENT:  Oropharynx clear without lesion.  Tongue normal. Mucous membranes moist.  RESPIRATORY:  Clear to auscultation without rales, wheezes or rhonchi. CARDIOVASCULAR:  Regular rate and rhythm without murmur, rub or gallop. ABDOMEN:  Soft, non-tender, with active bowel sounds, and no hepatosplenomegaly.  No masses. SKIN:  No rashes, ulcers or lesions. EXTREMITIES: Pain on deep palpation anteromedial hip.  Decreased range of motion in the hip secondary to pain.  No edema, no skin discoloration or tenderness.  No palpable cords. LYMPH NODES: No palpable cervical, supraclavicular, axillary or inguinal adenopathy  NEUROLOGICAL:  Alert & oriented, cranial nerves II-XII intact; motor strength 5/5 throughout; sensation intact; finger to nose and RAM normal; able to walk heel to toe; negative Rhomberg.  Walks with a limp. PSYCH:  Appropriate.    No visits with results within 3 Day(s) from this visit. Latest known visit with results is:  Admission on 06/04/2015, Discharged on 06/04/2015  Component Date Value Ref Range Status  . WBC 06/04/2015 10.7* 3.8 - 10.6 K/uL Final  . RBC 06/04/2015 5.54  4.40 - 5.90 MIL/uL Final  . Hemoglobin 06/04/2015 16.3  13.0 - 18.0 g/dL Final  . HCT 06/04/2015 47.4  40.0 - 52.0 % Final  . MCV 06/04/2015 85.6  80.0 - 100.0 fL Final  . MCH 06/04/2015 29.5  26.0 - 34.0 pg Final  . MCHC 06/04/2015 34.5  32.0 - 36.0 g/dL Final  . RDW 06/04/2015 13.0  11.5 - 14.5 % Final  . Platelets 06/04/2015 263  150  - 440 K/uL Final  . Neutrophils Relative % 06/04/2015 57   Final  . Neutro Abs 06/04/2015 6.2  1.4 - 6.5 K/uL Final  . Lymphocytes Relative 06/04/2015 32   Final  . Lymphs Abs 06/04/2015 3.4  1.0 - 3.6 K/uL Final  . Monocytes Relative 06/04/2015 8   Final  . Monocytes Absolute 06/04/2015 0.8  0.2 - 1.0 K/uL Final  . Eosinophils Relative 06/04/2015 2   Final  . Eosinophils Absolute 06/04/2015 0.2  0 - 0.7 K/uL Final  . Basophils Relative 06/04/2015 1   Final  . Basophils Absolute 06/04/2015 0.1  0 - 0.1 K/uL Final  . Sodium 06/04/2015 140  135 - 145 mmol/L Final  . Potassium 06/04/2015 4.3  3.5 - 5.1 mmol/L Final  . Chloride 06/04/2015 106  101 - 111 mmol/L Final  . CO2 06/04/2015 27  22 - 32 mmol/L Final  . Glucose, Bld 06/04/2015 94  65 - 99 mg/dL Final  . BUN 06/04/2015 14  6 - 20 mg/dL Final  .  Creatinine, Ser 06/04/2015 0.77  0.61 - 1.24 mg/dL Final  . Calcium 06/04/2015 9.3  8.9 - 10.3 mg/dL Final  . Total Protein 06/04/2015 7.3  6.5 - 8.1 g/dL Final  . Albumin 06/04/2015 3.6  3.5 - 5.0 g/dL Final  . AST 06/04/2015 18  15 - 41 U/L Final  . ALT 06/04/2015 24  17 - 63 U/L Final  . Alkaline Phosphatase 06/04/2015 69  38 - 126 U/L Final  . Total Bilirubin 06/04/2015 0.6  0.3 - 1.2 mg/dL Final  . GFR calc non Af Amer 06/04/2015 >60  >60 mL/min Final  . GFR calc Af Amer 06/04/2015 >60  >60 mL/min Final   Comment: (NOTE) The eGFR has been calculated using the CKD EPI equation. This calculation has not been validated in all clinical situations. eGFR's persistently <60 mL/min signify possible Chronic Kidney Disease.   . Anion gap 06/04/2015 7  5 - 15 Final  . Total Protein ELP 06/04/2015 7.4  6.0 - 8.5 g/dL Final  . Albumin ELP 06/04/2015 3.9  2.9 - 4.4 g/dL Final  . Alpha-1-Globulin 06/04/2015 0.3  0.0 - 0.4 g/dL Final  . Alpha-2-Globulin 06/04/2015 0.9  0.4 - 1.0 g/dL Final  . Beta Globulin 06/04/2015 1.2  0.7 - 1.3 g/dL Final  . Gamma Globulin 06/04/2015 1.0  0.4 - 1.8 g/dL  Final  . M-Spike, % 06/04/2015 Not Observed  Not Observed g/dL Final  . SPE Interp. 06/04/2015 Comment   Final   Comment: (NOTE) The SPE pattern appears essentially unremarkable. Evidence of monoclonal protein is not apparent. Performed At: [facility] [place], [place] [id number] Lindon Romp MD [id number]   . Comment 06/04/2015 Comment   Final   Comment: (NOTE) Protein electrophoresis scan will follow via computer, mail, or courier delivery.   Marland Kitchen GLOBULIN, TOTAL 06/04/2015 3.5  2.2 - 3.9 g/dL Corrected  . A/G Ratio 06/04/2015 1.1  0.7 - 1.7 Corrected  . IgG (Immunoglobin G), Serum 06/04/2015 923  700 - 1600 mg/dL Final  . IgA 06/04/2015 355  90 - 386 mg/dL Final  . IgM, Serum 06/04/2015 94  20 - 172 mg/dL Final   Comment: (NOTE) Performed At: [facility] [place], [place] [id number] Lindon Romp MD [id number]   . Kappa free light chain 06/04/2015 19.78* 3.30 - 19.40 mg/L Final  . Lamda free light chains 06/04/2015 18.43  5.71 - 26.30 mg/L Final  . Kappa, lamda light chain ratio 06/04/2015 1.07  0.26 - 1.65 Final   Comment: (NOTE) Performed At: [facility] [place], [place] [id number] Lindon Romp MD [id number]     Assessment:  VALEN GILLISON is a 55 y.o. male with probable metastatic lung cancer.  He presented with a 2 month history of left hip pain.  CXR revealed a nodular density in the right upper lobe.  CT scan of the left hip on 06/04/2015 revealed a 3.3 x 2.5 cm destructive lesion in the left parasymphyseal pubic bone and superior pubic ramus.  Surrounding sift tissues demonstrated a low-attenuation soft tissue component of the lesion.  There was subtle lucency of the left greater trochanter and a small focus of cortical destruction along the anterior aspect of the greater trochanter.  There was also lucency in the periphery of the left femoral head without cortical  disruption.  CXR on 06/04/2015 revealed a 3 x 1.8 x 2.3 cm nodular density in the right upper lobe.  There were no  acute osseous findings.  Symptomatically, he feels well except for left hip pain.  Exam reveals tenderness and decreased range of motion in the hip secondary to pain.  Neurologic exam is normal.  Plan: 1. Review imaging studies and abnormalities in chest and hip/pubic bone.  Discuss possible metastatic lung cancer.  Discuss available labs.  No evidence of multiple myeloma.  Discuss plan for PET scan and biopsy. 2. PET scan:  Right lung mass and destructive change in pubic ramus and left femoral head. 3. Review imaging with radiology re: biopsy site. 4. Follow-up pending labs from ER. 5. RTC after PET scan for MD assessment.   Lequita Asal, MD  06/07/2015, 2:59 PM

## 2015-06-09 ENCOUNTER — Ambulatory Visit
Admission: RE | Admit: 2015-06-09 | Discharge: 2015-06-09 | Disposition: A | Discharge status: Home / Self Care | Payer: Medicaid Other | Source: Ambulatory Visit | Attending: Hematology and Oncology | Admitting: Hematology and Oncology

## 2015-06-09 DIAGNOSIS — C7951 Secondary malignant neoplasm of bone: Secondary | ICD-10-CM | POA: Diagnosis not present

## 2015-06-09 DIAGNOSIS — I700 Atherosclerosis of aorta: Secondary | ICD-10-CM | POA: Diagnosis not present

## 2015-06-09 DIAGNOSIS — C414 Malignant neoplasm of pelvic bones, sacrum and coccyx: Secondary | ICD-10-CM | POA: Diagnosis not present

## 2015-06-09 DIAGNOSIS — K760 Fatty (change of) liver, not elsewhere classified: Secondary | ICD-10-CM | POA: Insufficient documentation

## 2015-06-09 DIAGNOSIS — R918 Other nonspecific abnormal finding of lung field: Secondary | ICD-10-CM

## 2015-06-09 DIAGNOSIS — C787 Secondary malignant neoplasm of liver and intrahepatic bile duct: Secondary | ICD-10-CM | POA: Insufficient documentation

## 2015-06-09 LAB — GLUCOSE, CAPILLARY: Glucose-Capillary: 122 mg/dL — ABNORMAL HIGH (ref 65–99)

## 2015-06-09 MED ORDER — FLUDEOXYGLUCOSE F - 18 (FDG) INJECTION
12.7200 | Freq: Once | INTRAVENOUS | Status: AC | PRN
  Administered 2015-06-09: 12.72 via INTRAVENOUS

## 2015-06-11 ENCOUNTER — Encounter: Payer: Self-pay | Admitting: Hematology and Oncology

## 2015-06-11 ENCOUNTER — Other Ambulatory Visit: Payer: Self-pay | Admitting: Hematology and Oncology

## 2015-06-11 ENCOUNTER — Other Ambulatory Visit: Payer: Self-pay | Admitting: Radiology

## 2015-06-11 ENCOUNTER — Other Ambulatory Visit: Payer: Self-pay | Admitting: General Surgery

## 2015-06-11 ENCOUNTER — Other Ambulatory Visit: Payer: Self-pay

## 2015-06-11 ENCOUNTER — Inpatient Hospital Stay (HOSPITAL_BASED_OUTPATIENT_CLINIC_OR_DEPARTMENT_OTHER): Payer: Medicaid Other | Admitting: Hematology and Oncology

## 2015-06-11 VITALS — BP 167/119 | HR 73 | Temp 98.0°F | Resp 18 | Ht 71.0 in | Wt 217.8 lb

## 2015-06-11 DIAGNOSIS — M25552 Pain in left hip: Secondary | ICD-10-CM

## 2015-06-11 DIAGNOSIS — R918 Other nonspecific abnormal finding of lung field: Secondary | ICD-10-CM

## 2015-06-11 DIAGNOSIS — C787 Secondary malignant neoplasm of liver and intrahepatic bile duct: Secondary | ICD-10-CM

## 2015-06-11 DIAGNOSIS — F17210 Nicotine dependence, cigarettes, uncomplicated: Secondary | ICD-10-CM

## 2015-06-11 DIAGNOSIS — Z5111 Encounter for antineoplastic chemotherapy: Secondary | ICD-10-CM | POA: Diagnosis not present

## 2015-06-11 DIAGNOSIS — Z79899 Other long term (current) drug therapy: Secondary | ICD-10-CM

## 2015-06-11 DIAGNOSIS — C414 Malignant neoplasm of pelvic bones, sacrum and coccyx: Secondary | ICD-10-CM

## 2015-06-11 DIAGNOSIS — F419 Anxiety disorder, unspecified: Secondary | ICD-10-CM

## 2015-06-11 DIAGNOSIS — I10 Essential (primary) hypertension: Secondary | ICD-10-CM

## 2015-06-11 MED ORDER — LORAZEPAM 0.5 MG PO TABS: 0.5000 mg | ORAL_TABLET | Freq: Four times a day (QID) | ORAL | Status: DC | PRN

## 2015-06-14 ENCOUNTER — Other Ambulatory Visit: Payer: Self-pay | Admitting: Hematology and Oncology

## 2015-06-14 ENCOUNTER — Ambulatory Visit
Admission: RE | Admit: 2015-06-14 | Discharge: 2015-06-14 | Disposition: A | Discharge status: Home / Self Care | Payer: Medicaid Other | Source: Ambulatory Visit | Attending: Hematology and Oncology | Admitting: Hematology and Oncology

## 2015-06-14 ENCOUNTER — Encounter: Payer: Self-pay | Admitting: Hematology and Oncology

## 2015-06-14 DIAGNOSIS — G893 Neoplasm related pain (acute) (chronic): Secondary | ICD-10-CM

## 2015-06-14 DIAGNOSIS — C787 Secondary malignant neoplasm of liver and intrahepatic bile duct: Secondary | ICD-10-CM | POA: Insufficient documentation

## 2015-06-14 DIAGNOSIS — C3490 Malignant neoplasm of unspecified part of unspecified bronchus or lung: Secondary | ICD-10-CM | POA: Insufficient documentation

## 2015-06-14 DIAGNOSIS — C414 Malignant neoplasm of pelvic bones, sacrum and coccyx: Secondary | ICD-10-CM

## 2015-06-14 LAB — CBC
HCT: 47.1 % (ref 40.0–52.0)
HEMOGLOBIN: 16.2 g/dL (ref 13.0–18.0)
MCH: 29.5 pg (ref 26.0–34.0)
MCHC: 34.4 g/dL (ref 32.0–36.0)
MCV: 85.7 fL (ref 80.0–100.0)
Platelets: 320 10*3/uL (ref 150–440)
RBC: 5.5 MIL/uL (ref 4.40–5.90)
RDW: 13.4 % (ref 11.5–14.5)
WBC: 11.3 10*3/uL — ABNORMAL HIGH (ref 3.8–10.6)

## 2015-06-14 LAB — APTT: aPTT: 28 seconds (ref 24–36)

## 2015-06-14 LAB — PROTIME-INR
INR: 0.97
Prothrombin Time: 13.1 seconds (ref 11.4–15.0)

## 2015-06-14 MED ORDER — SODIUM CHLORIDE 0.9 % IV SOLN
INTRAVENOUS | Status: DC
Start: 2015-06-14 — End: 2015-06-15
  Administered 2015-06-14: 10:00:00 via INTRAVENOUS

## 2015-06-14 MED ORDER — FENTANYL CITRATE (PF) 100 MCG/2ML IJ SOLN
INTRAMUSCULAR | Status: AC
  Filled 2015-06-14: qty 4

## 2015-06-14 MED ORDER — HYDROCODONE-ACETAMINOPHEN 5-325 MG PO TABS
1.0000 | ORAL_TABLET | ORAL | Status: DC | PRN
Start: 2015-06-14 — End: 2015-06-15
  Filled 2015-06-14: qty 2

## 2015-06-14 MED ORDER — MIDAZOLAM HCL 5 MG/5ML IJ SOLN
INTRAMUSCULAR | Status: AC | PRN
  Administered 2015-06-14 (×2): 1 mg via INTRAVENOUS
  Administered 2015-06-14: 0.5 mg via INTRAVENOUS

## 2015-06-14 MED ORDER — MIDAZOLAM HCL 5 MG/5ML IJ SOLN
INTRAMUSCULAR | Status: AC
  Filled 2015-06-14: qty 5

## 2015-06-14 MED ORDER — OXYCODONE-ACETAMINOPHEN 7.5-325 MG PO TABS: 1.0000 | ORAL_TABLET | ORAL | Status: DC | PRN

## 2015-06-14 MED ORDER — FENTANYL CITRATE (PF) 100 MCG/2ML IJ SOLN
INTRAMUSCULAR | Status: AC | PRN
  Administered 2015-06-14: 50 ug via INTRAVENOUS
  Administered 2015-06-14: 25 ug via INTRAVENOUS

## 2015-06-14 MED ORDER — DIAZEPAM 5 MG PO TABS
5.0000 mg | ORAL_TABLET | Freq: Once | ORAL | Status: AC
  Administered 2015-06-14: 5 mg via ORAL

## 2015-06-14 MED ORDER — SODIUM CHLORIDE FLUSH 0.9 % IV SOLN
INTRAVENOUS | Status: AC
  Filled 2015-06-14: qty 10

## 2015-06-14 NOTE — Procedures (Signed)
Discussed procedure and risks with patient and family. Informed consent obtained. Will perform US-guided liver biopsy.

## 2015-06-14 NOTE — Procedures (Signed)
Under US guidance, biopsy of right hepatic lobe mass was performed. Without complication.

## 2015-06-14 NOTE — Progress Notes (Signed)
[facility] day:  06/11/2015  Chief Complaint: Chris Mason is a 55 y.o. male with a right lung mass and lytic lesion in the pubic bone who is seen for review of interval PET scan and discussion regarding direction of therapy.  HPI:   The patient was last seen in the medical oncology clinic on 06/07/2015.  At that time, he was seen for initial consultation regarding a right lung mass and left pubic bone destructive lesion.  He had a 33 pack year smoking history.  Findings were worrisome for metastatic lung cancer.  Labs from the emergency room had ruled out multiple myeloma.  He was scheduled for PET scan.  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 There was a dominant 2.0 x 1.8 cm right upper lobe pulmonary nodule (SUV 8.9) with thoracic nodal, hepatic, and multifocal osseous metastasis.  Examples within the left pubic bone at a SUV of 11.3, left transverse process at T7 (SUV of 8.9), C4 vertebral body (SUV of 5.7), lateral eighth left rib (SUV of 6.3.).  There was a 3.8 cm right hepatic lobe hypo-attenuating mass (SUV 8.4).  Symptomatically, he feels good except for left hip pain.  He has a limp because of discomfort with walking.  He denies any respiratory symptoms.  He has been quite anxious since his evaluation in the emergency room.  Past Medical History  Diagnosis Date  . Hypertension     Past Surgical History  Procedure Laterality Date  . Elbow arthroplasty      No family history on file.  Social History:  reports that he has been smoking Cigarettes.  He has been smoking about 0.25 packs per day. He does not have any smokeless tobacco history on file. He reports that he drinks about 1.8 oz of alcohol per week. He reports that he does not use illicit drugs.  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  It [birth year] today.  The patient is accompanied by several family members (wife, girlfriend, son, sister, and mother).  Allergies: No Known Allergies  Current Medications: Current Outpatient Prescriptions  Medication Sig Dispense Refill  . losartan-hydrochlorothiazide (HYZAAR) 50-12.5 MG per tablet Take 1 tablet by mouth daily.    Marland Kitchen oxyCODONE-acetaminophen (PERCOCET) 7.5-325 MG tablet Take 1 tablet by mouth every 4 (four) hours as needed for severe pain. 20 tablet 0  . LORazepam (ATIVAN) 0.5 MG tablet Take 1 tablet (0.5 mg total) by mouth every 6 (six) hours as needed for anxiety. 30 tablet 0   No current facility-administered medications for this visit.   Review of Systems:  GENERAL:  Feels healthy.  No fevers, sweats or weight loss. PERFORMANCE STATUS (ECOG):  1 HEENT:  No visual changes, runny nose, sore throat, mouth sores or tenderness. Lungs: Little cough in the morning, sometimes at night.  No shortness of breath or cough.  No hemoptysis. Cardiac:  No chest pain, palpitations, orthopnea, or PND. GI:  No nausea, vomiting, diarrhea, constipation, melena or hematochezia.  No prior colonoscopy. GU:  No urgency, frequency, dysuria, or hematuria. Musculoskeletal:  Left hip pain.  Left rib pain.  Back pain.  No joint pain.  No muscle tenderness. Extremities:  No pain or swelling. Skin:  No rashes, skin changes, or ulcers. Neuro:  Few headaches.  No numbness or weakness, balance or coordination issues.  Endocrine:  No diabetes, thyroid issues, hot flashes or night sweats. Psych:  Anxiety.  No mood changes or depression. Pain:  No focal pain. Review of systems:  All other systems reviewed and found to be negative.  Physical Exam: Blood pressure 167/119, pulse 73, temperature 98 F (36.7 C), temperature source Tympanic, resp. rate 18, height '5\' 11"'$  (1.803 m), weight 217 lb 13 oz (98.8 kg). GENERAL:  Well developed, well nourished, moving about in the exam room in no  acute distress.  He is anxious. MENTAL STATUS:  Alert and oriented to person, place and time. HEAD:  Short brown hair with graying goatee.  Normocephalic, atraumatic, face symmetric, no Cushingoid features. EYES: No conjunctivitis or scleral icterus. ABDOMEN:  Tender overlying left pubic bone.  Soft and no hepatosplenomegaly.  No masses. SKIN:  No rashes, ulcers or lesions. EXTREMITIES:No edema, no skin discoloration or tenderness.  No palpable cords. NEUROLOGICAL:  Appropriate.  Walks with a limp. PSYCH:  Appropriate.    Hospital Outpatient Visit on 06/09/2015  Component Date Value Ref Range Status  . Glucose-Capillary 06/09/2015 122* 65 - 99 mg/dL Final    Assessment:  GILMAR BUA is a 56 y.o. male with probable metastatic lung cancer.  He presented with a 2 month history of left hip pain.  CXR revealed a nodular density in the right upper lobe.  CT scan of the left hip on 06/04/2015 revealed a 3.3 x 2.5 cm destructive lesion in the left parasymphyseal pubic bone and superior pubic ramus.  Surrounding sift tissues demonstrated a low-attenuation soft tissue component of the lesion.  There was subtle lucency of the left greater trochanter and a small focus of cortical destruction along the anterior aspect of the greater trochanter.  There was also lucency in the periphery of the left femoral head without cortical disruption.  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 There was a dominant 2.0 x 1.8 cm right upper lobe pulmonary nodule (SUV 8.9) with thoracic nodal, hepatic, and multifocal osseous metastasis.  Examples within the left pubic bone at a SUV of 11.3, left transverse process at T7 (SUV of 8.9), C4 vertebral body (SUV of 5.7), lateral eighth left rib (SUV of 6.3.).  There was a 3.8 cm right hepatic lobe hypo-attenuating mass (SUV 8.4).  Symptomatically, he feels well except for left hip pain.  Exam  reveals tenderness and decreased range of motion in the hip secondary to pain.  Neurologic exam is normal.  He is quite anxious.  Plan: 1.  Review PET scan in detail.  Images projected onto monitor in room. 2.  Discuss concern for metastatic lung cancer.  Discuss plan for ultrasound guided liver biopsy.  Discuss plan for head MRI to ensure no evidence of metastasis.  Discuss treatment of metastatic lung cancer (small cell versus non-small cell).  Discuss targeted therapy for lung cancer with driver mutations.  Discuss reconvneing next week after biopsy back for treatment plan. 3.  Discuss hypertension.  Patient encouraged to go to emergency room for management. 4.  Schedule ultrasound guided liver biopsy. 5.  Schedule head MRI with and without contrast. 6.  Rx:  Ativan 0.5 mg po q 6 hrs prn anxiety.  Patient not to operate heavy machinery or drive while taking medication. 7.  RTC on 06/17/2015 for review of biopsy and head MRI and discussion regarding treatment.  Approximately 45-50 minutes spent with patient and family discussing probable diagnosis and treatment.  Multiple  questions were asked and answered.   Lequita Asal, MD  06/11/2015

## 2015-06-15 ENCOUNTER — Ambulatory Visit (HOSPITAL_COMMUNITY)
Admission: RE | Admit: 2015-06-15 | Discharge: 2015-06-15 | Disposition: A | Discharge status: Home / Self Care | Payer: Medicaid Other | Source: Ambulatory Visit | Attending: Hematology and Oncology | Admitting: Hematology and Oncology

## 2015-06-15 ENCOUNTER — Other Ambulatory Visit: Payer: Self-pay | Admitting: Hematology and Oncology

## 2015-06-15 DIAGNOSIS — R9089 Other abnormal findings on diagnostic imaging of central nervous system: Secondary | ICD-10-CM | POA: Diagnosis not present

## 2015-06-15 DIAGNOSIS — C787 Secondary malignant neoplasm of liver and intrahepatic bile duct: Secondary | ICD-10-CM

## 2015-06-15 DIAGNOSIS — R918 Other nonspecific abnormal finding of lung field: Secondary | ICD-10-CM | POA: Insufficient documentation

## 2015-06-15 DIAGNOSIS — C414 Malignant neoplasm of pelvic bones, sacrum and coccyx: Secondary | ICD-10-CM | POA: Insufficient documentation

## 2015-06-15 MED ORDER — GADOBENATE DIMEGLUMINE 529 MG/ML IV SOLN
20.0000 mL | Freq: Once | INTRAVENOUS | Status: AC | PRN
  Administered 2015-06-15: 20 mL via INTRAVENOUS

## 2015-06-15 MED ORDER — GADOBENATE DIMEGLUMINE 529 MG/ML IV SOLN: 20.0000 mL | Freq: Once | INTRAVENOUS | Status: DC | PRN

## 2015-06-16 ENCOUNTER — Other Ambulatory Visit: Payer: Self-pay | Admitting: Hematology and Oncology

## 2015-06-16 DIAGNOSIS — C3491 Malignant neoplasm of unspecified part of right bronchus or lung: Secondary | ICD-10-CM

## 2015-06-18 ENCOUNTER — Other Ambulatory Visit: Payer: Self-pay

## 2015-06-18 ENCOUNTER — Inpatient Hospital Stay (HOSPITAL_BASED_OUTPATIENT_CLINIC_OR_DEPARTMENT_OTHER): Payer: Medicaid Other | Admitting: Hematology and Oncology

## 2015-06-18 ENCOUNTER — Ambulatory Visit: Payer: Self-pay

## 2015-06-18 VITALS — BP 182/99 | HR 85 | Temp 98.2°F | Resp 18 | Ht 70.5 in | Wt 217.8 lb

## 2015-06-18 DIAGNOSIS — C787 Secondary malignant neoplasm of liver and intrahepatic bile duct: Secondary | ICD-10-CM

## 2015-06-18 DIAGNOSIS — Z79899 Other long term (current) drug therapy: Secondary | ICD-10-CM

## 2015-06-18 DIAGNOSIS — C414 Malignant neoplasm of pelvic bones, sacrum and coccyx: Secondary | ICD-10-CM

## 2015-06-18 DIAGNOSIS — F17210 Nicotine dependence, cigarettes, uncomplicated: Secondary | ICD-10-CM

## 2015-06-18 DIAGNOSIS — C7951 Secondary malignant neoplasm of bone: Secondary | ICD-10-CM

## 2015-06-18 DIAGNOSIS — F419 Anxiety disorder, unspecified: Secondary | ICD-10-CM

## 2015-06-18 DIAGNOSIS — Z5111 Encounter for antineoplastic chemotherapy: Secondary | ICD-10-CM | POA: Diagnosis not present

## 2015-06-18 DIAGNOSIS — C3491 Malignant neoplasm of unspecified part of right bronchus or lung: Secondary | ICD-10-CM

## 2015-06-18 DIAGNOSIS — C3411 Malignant neoplasm of upper lobe, right bronchus or lung: Secondary | ICD-10-CM

## 2015-06-18 DIAGNOSIS — G893 Neoplasm related pain (acute) (chronic): Secondary | ICD-10-CM

## 2015-06-18 DIAGNOSIS — M25552 Pain in left hip: Secondary | ICD-10-CM

## 2015-06-18 MED ORDER — LORAZEPAM 0.5 MG PO TABS: 0.5000 mg | ORAL_TABLET | Freq: Four times a day (QID) | ORAL | Status: DC | PRN

## 2015-06-18 MED ORDER — OXYCODONE HCL 5 MG PO CAPS: ORAL_CAPSULE | ORAL | Status: DC

## 2015-06-21 ENCOUNTER — Encounter: Payer: Self-pay | Admitting: Hematology and Oncology

## 2015-06-21 ENCOUNTER — Other Ambulatory Visit: Payer: Self-pay | Admitting: *Deleted

## 2015-06-21 ENCOUNTER — Other Ambulatory Visit: Payer: Self-pay

## 2015-06-21 DIAGNOSIS — C3491 Malignant neoplasm of unspecified part of right bronchus or lung: Secondary | ICD-10-CM

## 2015-06-21 MED ORDER — OXYCODONE HCL 5 MG PO TABS: 5.0000 mg | ORAL_TABLET | ORAL | Status: DC | PRN

## 2015-06-21 MED ORDER — ONDANSETRON HCL 8 MG PO TABS: 8.0000 mg | ORAL_TABLET | Freq: Two times a day (BID) | ORAL | Status: DC | PRN

## 2015-06-21 NOTE — Progress Notes (Signed)
[facility] day:  06/18/2015  Chief Complaint: Chris Mason is a 55 y.o. male with a right lung mass and lytic lesion in the pubic bone who is seen for review of interval head MRI and liver biopsy and discussion regarding direction of therapy.  HPI:   The patient was last seen in the medical oncology clinic on 06/11/2015.  At that time, PET scan was reviewed.  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 There was a dominant 2.0 x 1.8 cm right upper lobe pulmonary nodule (SUV 8.9) with thoracic nodal, hepatic, and multifocal osseous metastasis.  Symptomatically, he feels good except for left hip pain.  He had a limp because of discomfort with walking.  He denied any respiratory symptoms.    At last visit, we discussed obtaining a liver biopsy.  We also discussed obtaining a head MRI. We discussed follow-up in the emergency room or with a primary care provider regarding his hypertension.  He was given a prescription for Ativan for his anxiety.  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  The carcinoma was positive for CD56 and negative for p40.   Head MRI on 06/16/2015 revealed no evidence for metastatic disease.  There was nonspecific scattered subcortical white matter changes which can be seen in the setting of chronic microvascular ischemia, a demyelinating process such as multiple sclerosis, vasculitis, complicated migraine headaches, or as the sequelae of a prior infectious or inflammatory process.  Symptomatically, he feels fine except for left hip pain.  He is taking oxycodone 7.5 mg/acetaminophen 325 mg every 4-6 hours as needed for pain.   Past Medical History  Diagnosis Date  . Hypertension     Past Surgical History  Procedure Laterality Date  . Elbow arthroplasty      No family history on file.  Social History:   reports that he has been smoking Cigarettes.  He has a 8.75 pack-year smoking history. He does not have any smokeless tobacco history on file. He reports that he drinks about 1.8 oz of alcohol per week. He reports that he does not use illicit drugs.  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  The patient is accompanied by several family members (wife, girlfriend, and mother).  Allergies: No Known Allergies  Current Medications: Current Outpatient Prescriptions  Medication Sig Dispense Refill  . losartan-hydrochlorothiazide (HYZAAR) 50-12.5 MG per tablet Take 1 tablet by mouth daily.    Marland Kitchen oxyCODONE-acetaminophen (PERCOCET) 7.5-325 MG tablet Take 1 tablet by mouth every 4 (four) hours as needed for severe pain. 20 tablet 0  . LORazepam (ATIVAN) 0.5 MG tablet Take 1 tablet (0.5 mg total) by mouth every 6 (six) hours as needed for anxiety. 30 tablet 0  . oxycodone (OXY-IR) 5 MG capsule 1-2 tablets every 4 to 6 hours as needed for pain 40 capsule 0   No current facility-administered medications for this visit.   Review of Systems:  GENERAL:  Feels fine.  No fevers, sweats or weight loss. PERFORMANCE STATUS (ECOG):  1 HEENT:  No visual changes, runny nose, sore throat, mouth sores or tenderness. Lungs: Little cough in the morning, sometimes at night.  No shortness of breath or cough.  No hemoptysis. Cardiac:  No chest pain, palpitations, orthopnea, or PND. GI:  No nausea, vomiting, diarrhea, constipation, melena or hematochezia.  No prior colonoscopy. GU:  No urgency, frequency, dysuria, or hematuria. Musculoskeletal:  Left hip pain.  Left rib pain.  Back pain.  No joint pain.  No muscle tenderness. Extremities:  No pain or swelling. Skin:  No rashes, skin changes, or ulcers. Neuro:  No headaches, numbness or weakness, balance or coordination issues. Endocrine:  No diabetes, thyroid issues, hot flashes or night sweats. Psych:   Anxiety.  No mood changes or depression. Pain:  No focal pain. Review of systems:  All other systems reviewed and found to be negative.  Physical Exam: Blood pressure 182/99, pulse 85, temperature 98.2 F (36.8 C), resp. rate 18, height 5' 10.5" (1.791 m), weight 217 lb 13 oz (98.8 kg). GENERAL:  Well developed, well nourished, moving about in the exam room in no acute distress.  He is anxious. MENTAL STATUS:  Alert and oriented to person, place and time. HEAD:  Short brown hair with graying goatee.  Normocephalic, atraumatic, face symmetric, no Cushingoid features. EYES: No conjunctivitis or scleral icterus. NEUROLOGICAL:  Appropriate.  Walks with a slight limp. PSYCH:  Appropriate.    No visits with results within 3 Day(s) from this visit. Latest known visit with results is:  Hospital Outpatient Visit on 06/14/2015  Component Date Value Ref Range Status  . aPTT 06/14/2015 28  24 - 36 seconds Final  . WBC 06/14/2015 11.3* 3.8 - 10.6 K/uL Final  . RBC 06/14/2015 5.50  4.40 - 5.90 MIL/uL Final  . Hemoglobin 06/14/2015 16.2  13.0 - 18.0 g/dL Final  . HCT 06/14/2015 47.1  40.0 - 52.0 % Final  . MCV 06/14/2015 85.7  80.0 - 100.0 fL Final  . MCH 06/14/2015 29.5  26.0 - 34.0 pg Final  . MCHC 06/14/2015 34.4  32.0 - 36.0 g/dL Final  . RDW 06/14/2015 13.4  11.5 - 14.5 % Final  . Platelets 06/14/2015 320  150 - 440 K/uL Final  . Prothrombin Time 06/14/2015 13.1  11.4 - 15.0 seconds Final  . INR 06/14/2015 0.97   Final  . SURGICAL PATHOLOGY 06/14/2015    Final                   Value:Surgical Pathology CASE: [id number] PATIENT: Chris Mason Surgical Pathology Report     SPECIMEN SUBMITTED: A. Liver mass, right, metastases, bx  CLINICAL HISTORY: Right lung mass, left pubic bone destructive lesion, and 3.8 cm hepatic mass  in smoker  PRE-OPERATIVE DIAGNOSIS: Metastases  POST-OPERATIVE DIAGNOSIS: Same as pre-op     DIAGNOSIS: A. LIVER MASS, RIGHT; ULTRASOUND GUIDED  BIOPSY: - METASTATIC SMALL CELL CARCINOMA.  Comment: The carcinoma is positive for CD56 and negative for p40. This pattern of immunoreactivity supports the above diagnosis. Stain controls worked appropriately. Preliminary diagnosis called to Dr. Mike Gip on 06/15/15.   GROSS DESCRIPTION: A. Labeled: Liver mass  Tissue fragment(s): 4  Size: 0.7-1.5 x 0.1 cm  Description: Pink red cylindrically shaped tissue fragments, inked black  Entirely submitted in on 4 cassette(s).     Final Diagnosis performed by Quay Burow, MD.  Electronically signed 06/16/2015 4:2                         3:32PM    The electronic signature indicates that the named Attending Pathologist has evaluated the specimen  Technical component performed at [facility], [address], [place], [place] [address] Lab: [phone] Dir: Darrick Penna. Evette Doffing, MD  Professional component performed at [facility], [facility], [address]  [place], [place], [place] [address] Lab: [phone] Dir: Dellia Nims. Reuel Derby, MD      Assessment:  HARJIT LEIDER is a 55 y.o. male with extensive stage small cell lung cancer.  He presented with a 2 month history of left hip pain.  CXR revealed a nodular density in the right upper lobe.  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    CT scan of the left hip on 06/04/2015 revealed a 3.3 x 2.5 cm destructive lesion in the left parasymphyseal pubic bone and superior pubic ramus.  Surrounding sift tissues demonstrated a low-attenuation soft tissue component of the lesion.  There was subtle lucency of the left greater trochanter and a small focus of cortical destruction along the anterior aspect of the greater trochanter.  There was also lucency in the periphery of the left femoral head without cortical disruption.  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  There was a dominant 2.0 x 1.8 cm right upper lobe pulmonary nodule (SUV 8.9) with thoracic nodal, hepatic, and multifocal osseous metastasis.  Examples within the left pubic bone at a SUV of 11.3, left transverse process at T7 (SUV of 8.9), C4 vertebral body (SUV of 5.7), lateral eighth left rib (SUV of 6.3.).  There was a 3.8 cm right hepatic lobe hypo-attenuating mass (SUV 8.4).  Head MRI on 06/16/2015 revealed no evidence for metastatic disease.   Symptomatically, he feels well except for left hip pain.  Exam reveals tenderness and decreased range of motion in the hip secondary to pain.  Neurologic exam is normal.  He remains anxious.  Plan: 1.  Review head MRI and liver biopsy. 2.  Discuss diagnosis of extensive small stage lung cancer.  Discuss prognosis and palliative chemotherapy.  Discuss tumor is extremely chemotherapy sensitive, but will recur.  Discuss plan for 4-6 cycles of carboplatin and etoposide (given day 1-3 every 21 days) with Neulasta support.  Discuss side effects of chemotherapy including myelosuppression, nausea, vomiting, hair loss, electrolytes wasting (potassium and magnesium), hypotension and small risk of leukemia (with etoposide).  Discuss risk of infection when counts are low (neutropenia).  Discuss consideration of port-a-cath placement.  Discuss nursing to assess veins for peripheral chemotherapy administration.   Patient wishes 1st cycle via PIV.  Discuss reassessment every 2 cycles with imaging (PET after 4th cycle).  Discuss possible prophylactic cranial irradiation (PCI) if excellent response to chemotherapy. 3.  Discuss addition of Xgeva monthly given metastatic bone lesions to prevent bone related events. 4.  Discuss pain management.  Discuss keeping pain dairy with plan to switch to long acting pain medication (Oxycontin or Fentanyl patch) based on use of prn pain medications. 5.  Schedule chemotherapy class. 6.  Discuss follow-up with Elease Etienne, social worker,  as patient self pay. 7.  Rx:  Oxycodone 5-10 mg po q 4-6 hrs prn pain.  8.  Rx:  Ativan 0.5 mg po q 6 hrs prn anxiety. 9.  RTC on 06/23/2015 for MD assessment, labs (CBC with diff, CMP, Mg, CEA), and cycle #1 carboplatin and etoposide with On-Pro Neulasta support +/- Xgeva.  Approximately 45 minutes spent with patient and family discussing his diagnosis, treatment plan, and management of symptoms.  Multiple questions were asked and answered.   Lequita Asal, MD  06/18/2015

## 2015-06-22 ENCOUNTER — Inpatient Hospital Stay: Payer: Medicaid Other

## 2015-06-22 NOTE — Patient Instructions (Signed)
Etoposide, VP-16 injection  What is this medicine?  ETOPOSIDE, VP-16 (e toe POE side) is a chemotherapy drug. It is used to treat testicular cancer, lung cancer, and other cancers.  This medicine may be used for other purposes; ask your health care provider or pharmacist if you have questions.  What should I tell my health care provider before I take this medicine?  They need to know if you have any of these conditions:  -infection  -kidney disease  -low blood counts, like low white cell, platelet, or red cell counts  -an unusual or allergic reaction to etoposide, other chemotherapeutic agents, other medicines, foods, dyes, or preservatives  -pregnant or trying to get pregnant  -breast-feeding  How should I use this medicine?  This medicine is for infusion into a vein. It is administered in a hospital or clinic by a specially trained health care professional.  Talk to your pediatrician regarding the use of this medicine in children. Special care may be needed.  Overdosage: If you think you have taken too much of this medicine contact a poison control center or emergency room at once.  NOTE: This medicine is only for you. Do not share this medicine with others.  What if I miss a dose?  It is important not to miss your dose. Call your doctor or health care professional if you are unable to keep an appointment.  What may interact with this medicine?  -aspirin  -certain medications for seizures like carbamazepine, phenobarbital, phenytoin, valproic acid  -cyclosporine  -levamisole  -warfarin  This list may not describe all possible interactions. Give your health care provider a list of all the medicines, herbs, non-prescription drugs, or dietary supplements you use. Also tell them if you smoke, drink alcohol, or use illegal drugs. Some items may interact with your medicine.  What should I watch for while using this medicine?  Visit your doctor for checks on your progress. This drug may make you feel generally unwell.  This is not uncommon, as chemotherapy can affect healthy cells as well as cancer cells. Report any side effects. Continue your course of treatment even though you feel ill unless your doctor tells you to stop.  In some cases, you may be given additional medicines to help with side effects. Follow all directions for their use.  Call your doctor or health care professional for advice if you get a fever, chills or sore throat, or other symptoms of a cold or flu. Do not treat yourself. This drug decreases your body's ability to fight infections. Try to avoid being around people who are sick.  This medicine may increase your risk to bruise or bleed. Call your doctor or health care professional if you notice any unusual bleeding.  Be careful brushing and flossing your teeth or using a toothpick because you may get an infection or bleed more easily. If you have any dental work done, tell your dentist you are receiving this medicine.  Avoid taking products that contain aspirin, acetaminophen, ibuprofen, naproxen, or ketoprofen unless instructed by your doctor. These medicines may hide a fever.  Do not become pregnant while taking this medicine or for at least 6 months after stopping it. Women should inform their doctor if they wish to become pregnant or think they might be pregnant. Women of child-bearing potential will need to have a negative pregnancy test before starting this medicine. There is a potential for serious side effects to an unborn child. Talk to your health care professional or   pharmacist for more information. Do not breast-feed an infant while taking this medicine.  Men must use a latex condom during sexual contact with a woman while taking this medicine and for at least 4 months after stopping it. A latex condom is needed even if you have had a vasectomy. Contact your doctor right away if your partner becomes pregnant. Do not donate sperm while taking this medicine and for at least 4 months after you stop  taking this medicine. Men should inform their doctors if they wish to father a child. This medicine may lower sperm counts.  What side effects may I notice from receiving this medicine?  Side effects that you should report to your doctor or health care professional as soon as possible:  -allergic reactions like skin rash, itching or hives, swelling of the face, lips, or tongue  -low blood counts - this medicine may decrease the number of white blood cells, red blood cells and platelets. You may be at increased risk for infections and bleeding.  -signs of infection - fever or chills, cough, sore throat, pain or difficulty passing urine  -signs of decreased platelets or bleeding - bruising, pinpoint red spots on the skin, black, tarry stools, blood in the urine  -signs of decreased red blood cells - unusually weak or tired, fainting spells, lightheadedness  -breathing problems  -changes in vision  -mouth or throat sores or ulcers  -pain, redness, swelling or irritation at the injection site  -pain, tingling, numbness in the hands or feet  -redness, blistering, peeling or loosening of the skin, including inside the mouth  -seizures  -vomiting  Side effects that usually do not require medical attention (report to your doctor or health care professional if they continue or are bothersome):  -diarrhea  -hair loss  -loss of appetite  -nausea  -stomach pain  This list may not describe all possible side effects. Call your doctor for medical advice about side effects. You may report side effects to FDA at 1-800-FDA-1088.  Where should I keep my medicine?  This drug is given in a hospital or clinic and will not be stored at home.  NOTE: This sheet is a summary. It may not cover all possible information. If you have questions about this medicine, talk to your doctor, pharmacist, or health care provider.      2016, Elsevier/[employer] Standard. (2013-11-13 12:32:50)  Carboplatin injection  What is this medicine?  CARBOPLATIN (KAR boe  pla tin) is a chemotherapy drug. It targets fast dividing cells, like cancer cells, and causes these cells to die. This medicine is used to treat ovarian cancer and many other cancers.  This medicine may be used for other purposes; ask your health care provider or pharmacist if you have questions.  What should I tell my health care provider before I take this medicine?  They need to know if you have any of these conditions:  -blood disorders  -hearing problems  -kidney disease  -recent or ongoing radiation therapy  -an unusual or allergic reaction to carboplatin, cisplatin, other chemotherapy, other medicines, foods, dyes, or preservatives  -pregnant or trying to get pregnant  -breast-feeding  How should I use this medicine?  This drug is usually given as an infusion into a vein. It is administered in a hospital or clinic by a specially trained health care professional.  Talk to your pediatrician regarding the use of this medicine in children. Special care may be needed.  Overdosage: If you think you have taken   too much of this medicine contact a poison control center or emergency room at once.  NOTE: This medicine is only for you. Do not share this medicine with others.  What if I miss a dose?  It is important not to miss a dose. Call your doctor or health care professional if you are unable to keep an appointment.  What may interact with this medicine?  -medicines for seizures  -medicines to increase blood counts like filgrastim, pegfilgrastim, sargramostim  -some antibiotics like amikacin, gentamicin, neomycin, streptomycin, tobramycin  -vaccines  Talk to your doctor or health care professional before taking any of these medicines:  -acetaminophen  -aspirin  -ibuprofen  -ketoprofen  -naproxen  This list may not describe all possible interactions. Give your health care provider a list of all the medicines, herbs, non-prescription drugs, or dietary supplements you use. Also tell them if you smoke, drink alcohol, or  use illegal drugs. Some items may interact with your medicine.  What should I watch for while using this medicine?  Your condition will be monitored carefully while you are receiving this medicine. You will need important blood work done while you are taking this medicine.  This drug may make you feel generally unwell. This is not uncommon, as chemotherapy can affect healthy cells as well as cancer cells. Report any side effects. Continue your course of treatment even though you feel ill unless your doctor tells you to stop.  In some cases, you may be given additional medicines to help with side effects. Follow all directions for their use.  Call your doctor or health care professional for advice if you get a fever, chills or sore throat, or other symptoms of a cold or flu. Do not treat yourself. This drug decreases your body's ability to fight infections. Try to avoid being around people who are sick.  This medicine may increase your risk to bruise or bleed. Call your doctor or health care professional if you notice any unusual bleeding.  Be careful brushing and flossing your teeth or using a toothpick because you may get an infection or bleed more easily. If you have any dental work done, tell your dentist you are receiving this medicine.  Avoid taking products that contain aspirin, acetaminophen, ibuprofen, naproxen, or ketoprofen unless instructed by your doctor. These medicines may hide a fever.  Do not become pregnant while taking this medicine. Women should inform their doctor if they wish to become pregnant or think they might be pregnant. There is a potential for serious side effects to an unborn child. Talk to your health care professional or pharmacist for more information. Do not breast-feed an infant while taking this medicine.  What side effects may I notice from receiving this medicine?  Side effects that you should report to your doctor or health care professional as soon as possible:  -allergic  reactions like skin rash, itching or hives, swelling of the face, lips, or tongue  -signs of infection - fever or chills, cough, sore throat, pain or difficulty passing urine  -signs of decreased platelets or bleeding - bruising, pinpoint red spots on the skin, black, tarry stools, nosebleeds  -signs of decreased red blood cells - unusually weak or tired, fainting spells, lightheadedness  -breathing problems  -changes in hearing  -changes in vision  -chest pain  -high blood pressure  -low blood counts - This drug may decrease the number of white blood cells, red blood cells and platelets. You may be at increased risk for   hospital or clinic and will not be stored at home. NOTE: This sheet is a summary. It may not cover all possible information. If you have questions about this medicine, talk to your doctor, pharmacist, or health care provider.    2016, [employer] Standard. (2007-06-25 14:38:05) Denosumab injection What is this medicine? DENOSUMAB (den oh sue mab) slows bone breakdown. Prolia is used to treat osteoporosis in women after menopause and in men. Delton See is used to prevent bone fractures and other bone problems caused by cancer bone metastases. Delton See is also used to treat giant cell tumor of the  bone. This medicine may be used for other purposes; ask your health care provider or pharmacist if you have questions. What should I tell my health care provider before I take this medicine? They need to know if you have any of these conditions: -dental disease -eczema -infection or history of infections -kidney disease or on dialysis -low blood calcium or vitamin D -malabsorption syndrome -scheduled to have surgery or tooth extraction -taking medicine that contains denosumab -thyroid or parathyroid disease -an unusual reaction to denosumab, other medicines, foods, dyes, or preservatives -pregnant or trying to get pregnant -breast-feeding How should I use this medicine? This medicine is for injection under the skin. It is given by a health care professional in a hospital or clinic setting. If you are getting Prolia, a special MedGuide will be given to you by the pharmacist with each prescription and refill. Be sure to read this information carefully each time. For Prolia, talk to your pediatrician regarding the use of this medicine in children. Special care may be needed. For Delton See, talk to your pediatrician regarding the use of this medicine in children. While this drug may be prescribed for children as young as 13 years for selected conditions, precautions do apply. Overdosage: If you think you have taken too much of this medicine contact a poison control center or emergency room at once. NOTE: This medicine is only for you. Do not share this medicine with others. What if I miss a dose? It is important not to miss your dose. Call your doctor or health care professional if you are unable to keep an appointment. What may interact with this medicine? Do not take this medicine with any of the following medications: -other medicines containing denosumab This medicine may also interact with the following medications: -medicines that suppress the immune system -medicines that treat  cancer -steroid medicines like prednisone or cortisone This list may not describe all possible interactions. Give your health care provider a list of all the medicines, herbs, non-prescription drugs, or dietary supplements you use. Also tell them if you smoke, drink alcohol, or use illegal drugs. Some items may interact with your medicine. What should I watch for while using this medicine? Visit your doctor or health care professional for regular checks on your progress. Your doctor or health care professional may order blood tests and other tests to see how you are doing. Call your doctor or health care professional if you get a cold or other infection while receiving this medicine. Do not treat yourself. This medicine may decrease your body's ability to fight infection. You should make sure you get enough calcium and vitamin D while you are taking this medicine, unless your doctor tells you not to. Discuss the foods you eat and the vitamins you take with your health care professional. See your dentist regularly. Brush and floss your teeth as directed. Before you have any dental work done,  tell your dentist you are receiving this medicine. Do not become pregnant while taking this medicine or for 5 months after stopping it. Women should inform their doctor if they wish to become pregnant or think they might be pregnant. There is a potential for serious side effects to an unborn child. Talk to your health care professional or pharmacist for more information. What side effects may I notice from receiving this medicine? Side effects that you should report to your doctor or health care professional as soon as possible: -allergic reactions like skin rash, itching or hives, swelling of the face, lips, or tongue -breathing problems -chest pain -fast, irregular heartbeat -feeling faint or lightheaded, falls -fever, chills, or any other sign of infection -muscle spasms, tightening, or twitches -numbness or  tingling -skin blisters or bumps, or is dry, peels, or red -slow healing or unexplained pain in the mouth or jaw -unusual bleeding or bruising Side effects that usually do not require medical attention (Report these to your doctor or health care professional if they continue or are bothersome.): -muscle pain -stomach upset, gas This list may not describe all possible side effects. Call your doctor for medical advice about side effects. You may report side effects to FDA at 1-800-FDA-1088. Where should I keep my medicine? This medicine is only given in a clinic, doctor's office, or other health care setting and will not be stored at home. NOTE: This sheet is a summary. It may not cover all possible information. If you have questions about this medicine, talk to your doctor, pharmacist, or health care provider.    2016, [employer] Standard. (2011-09-18 12:37:47)

## 2015-06-23 ENCOUNTER — Inpatient Hospital Stay: Payer: Medicaid Other

## 2015-06-23 ENCOUNTER — Encounter: Payer: Self-pay | Admitting: *Deleted

## 2015-06-23 ENCOUNTER — Inpatient Hospital Stay (HOSPITAL_BASED_OUTPATIENT_CLINIC_OR_DEPARTMENT_OTHER): Payer: Medicaid Other | Admitting: Oncology

## 2015-06-23 VITALS — BP 117/70 | HR 67 | Resp 18

## 2015-06-23 VITALS — BP 123/74 | HR 82 | Temp 97.7°F | Resp 18 | Ht 70.5 in | Wt 214.1 lb

## 2015-06-23 DIAGNOSIS — F419 Anxiety disorder, unspecified: Secondary | ICD-10-CM

## 2015-06-23 DIAGNOSIS — I10 Essential (primary) hypertension: Secondary | ICD-10-CM

## 2015-06-23 DIAGNOSIS — C787 Secondary malignant neoplasm of liver and intrahepatic bile duct: Secondary | ICD-10-CM

## 2015-06-23 DIAGNOSIS — C3491 Malignant neoplasm of unspecified part of right bronchus or lung: Secondary | ICD-10-CM

## 2015-06-23 DIAGNOSIS — C7951 Secondary malignant neoplasm of bone: Secondary | ICD-10-CM

## 2015-06-23 DIAGNOSIS — C414 Malignant neoplasm of pelvic bones, sacrum and coccyx: Secondary | ICD-10-CM

## 2015-06-23 DIAGNOSIS — R918 Other nonspecific abnormal finding of lung field: Secondary | ICD-10-CM

## 2015-06-23 DIAGNOSIS — C3411 Malignant neoplasm of upper lobe, right bronchus or lung: Secondary | ICD-10-CM

## 2015-06-23 DIAGNOSIS — Z5111 Encounter for antineoplastic chemotherapy: Secondary | ICD-10-CM | POA: Diagnosis not present

## 2015-06-23 DIAGNOSIS — Z79899 Other long term (current) drug therapy: Secondary | ICD-10-CM

## 2015-06-23 DIAGNOSIS — M25552 Pain in left hip: Secondary | ICD-10-CM

## 2015-06-23 DIAGNOSIS — F17210 Nicotine dependence, cigarettes, uncomplicated: Secondary | ICD-10-CM

## 2015-06-23 LAB — CBC WITH DIFFERENTIAL/PLATELET
Basophils Absolute: 0.1 10*3/uL (ref 0–0.1)
Basophils Relative: 1 %
Eosinophils Absolute: 0.2 10*3/uL (ref 0–0.7)
Eosinophils Relative: 2 %
HCT: 46 % (ref 40.0–52.0)
Hemoglobin: 16.1 g/dL (ref 13.0–18.0)
Lymphocytes Relative: 23 %
Lymphs Abs: 2.6 10*3/uL (ref 1.0–3.6)
MCH: 30.1 pg (ref 26.0–34.0)
MCHC: 34.9 g/dL (ref 32.0–36.0)
MCV: 86.2 fL (ref 80.0–100.0)
Monocytes Absolute: 1.1 10*3/uL — ABNORMAL HIGH (ref 0.2–1.0)
Monocytes Relative: 10 %
Neutro Abs: 7.5 10*3/uL — ABNORMAL HIGH (ref 1.4–6.5)
Neutrophils Relative %: 64 %
Platelets: 298 10*3/uL (ref 150–440)
RBC: 5.33 MIL/uL (ref 4.40–5.90)
RDW: 13 % (ref 11.5–14.5)
WBC: 11.6 10*3/uL — ABNORMAL HIGH (ref 3.8–10.6)

## 2015-06-23 LAB — MAGNESIUM: Magnesium: 2.1 mg/dL (ref 1.7–2.4)

## 2015-06-23 LAB — COMPREHENSIVE METABOLIC PANEL
ALT: 27 U/L (ref 17–63)
AST: 21 U/L (ref 15–41)
Albumin: 4.1 g/dL (ref 3.5–5.0)
Alkaline Phosphatase: 81 U/L (ref 38–126)
Anion gap: 7 (ref 5–15)
BUN: 21 mg/dL — ABNORMAL HIGH (ref 6–20)
CO2: 25 mmol/L (ref 22–32)
Calcium: 9.3 mg/dL (ref 8.9–10.3)
Chloride: 103 mmol/L (ref 101–111)
Creatinine, Ser: 0.92 mg/dL (ref 0.61–1.24)
GFR calc Af Amer: >60 mL/min (ref >60)
GFR calc non Af Amer: >60 mL/min (ref >60)
Glucose, Bld: 121 mg/dL — ABNORMAL HIGH (ref 65–99)
Potassium: 3.7 mmol/L (ref 3.5–5.1)
Sodium: 135 mmol/L (ref 135–145)
Total Bilirubin: 1.2 mg/dL (ref 0.3–1.2)
Total Protein: 8 g/dL (ref 6.5–8.1)

## 2015-06-23 LAB — PROTIME-INR
INR: 1.05
Prothrombin Time: 13.9 seconds (ref 11.4–15.0)

## 2015-06-23 LAB — LACTATE DEHYDROGENASE: LDH: 205 U/L — ABNORMAL HIGH (ref 98–192)

## 2015-06-23 MED ORDER — FENTANYL 25 MCG/HR TD PT72: 25.0000 ug | MEDICATED_PATCH | TRANSDERMAL | Status: DC

## 2015-06-23 MED ORDER — SODIUM CHLORIDE 0.9 % IV SOLN
10.0000 mg | Freq: Once | INTRAVENOUS | Status: AC
  Administered 2015-06-23: 10 mg via INTRAVENOUS
  Filled 2015-06-23: qty 1

## 2015-06-23 MED ORDER — DENOSUMAB 120 MG/1.7ML ~~LOC~~ SOLN
120.0000 mg | Freq: Once | SUBCUTANEOUS | Status: AC
  Administered 2015-06-23: 120 mg via SUBCUTANEOUS
  Filled 2015-06-23: qty 1.7

## 2015-06-23 MED ORDER — SODIUM CHLORIDE 0.9 % IV SOLN
100.0000 mg/m2 | Freq: Once | INTRAVENOUS | Status: AC
  Administered 2015-06-23: 220 mg via INTRAVENOUS
  Filled 2015-06-23: qty 11

## 2015-06-23 MED ORDER — PALONOSETRON HCL INJECTION 0.25 MG/5ML
0.2500 mg | Freq: Once | INTRAVENOUS | Status: AC
  Administered 2015-06-23: 0.25 mg via INTRAVENOUS
  Filled 2015-06-23: qty 5

## 2015-06-23 MED ORDER — SODIUM CHLORIDE 0.9 % IV SOLN
750.0000 mg | Freq: Once | INTRAVENOUS | Status: AC
  Administered 2015-06-23: 750 mg via INTRAVENOUS
  Filled 2015-06-23: qty 75

## 2015-06-23 MED ORDER — SODIUM CHLORIDE 0.9 % IV SOLN
Freq: Once | INTRAVENOUS | Status: AC
  Administered 2015-06-23: 10:00:00 via INTRAVENOUS
  Filled 2015-06-23: qty 1000

## 2015-06-23 MED ORDER — OXYCODONE HCL 5 MG PO TABS: 5.0000 mg | ORAL_TABLET | ORAL | Status: DC | PRN

## 2015-06-23 NOTE — Progress Notes (Signed)
Pt reports more left leg pain.  Pt reports slowing down in smoking and has cut in half.  Pt reports having right lower tooth pain.

## 2015-06-23 NOTE — Progress Notes (Unsigned)
PSN met with patient and his wife today.  Informed patient about the disability process, medicaid and other financial resources that could assist.

## 2015-06-23 NOTE — Progress Notes (Signed)
Port orders faxed to Dr. Dew/Dr. Delana Meyer, their office will call patient with appointment details. Patient was also given disability forms today that were filled out and signed by Dr. Grayland Ormond.

## 2015-06-23 NOTE — Progress Notes (Signed)
Patient has appointment to see Dr. Baruch Gouty next week Wednesday 3/29 @ 2:00 pm.

## 2015-06-24 ENCOUNTER — Other Ambulatory Visit: Payer: Self-pay | Admitting: Hematology and Oncology

## 2015-06-24 ENCOUNTER — Inpatient Hospital Stay: Payer: Medicaid Other

## 2015-06-24 VITALS — BP 120/78 | HR 70 | Resp 20

## 2015-06-24 DIAGNOSIS — C3491 Malignant neoplasm of unspecified part of right bronchus or lung: Secondary | ICD-10-CM

## 2015-06-24 DIAGNOSIS — Z5111 Encounter for antineoplastic chemotherapy: Secondary | ICD-10-CM | POA: Diagnosis not present

## 2015-06-24 LAB — CEA: CEA: 912.6 ng/mL — ABNORMAL HIGH (ref 0.0–4.7)

## 2015-06-24 MED ORDER — SODIUM CHLORIDE 0.9 % IV SOLN
10.0000 mg | Freq: Once | INTRAVENOUS | Status: AC
  Administered 2015-06-24: 10 mg via INTRAVENOUS
  Filled 2015-06-24: qty 1

## 2015-06-24 MED ORDER — HEPARIN SOD (PORK) LOCK FLUSH 100 UNIT/ML IV SOLN
INTRAVENOUS | Status: AC
  Filled 2015-06-24: qty 5

## 2015-06-24 MED ORDER — SODIUM CHLORIDE 0.9 % IV SOLN
Freq: Once | INTRAVENOUS | Status: AC
  Administered 2015-06-24: 12:00:00 via INTRAVENOUS
  Filled 2015-06-24: qty 1000

## 2015-06-24 MED ORDER — SODIUM CHLORIDE 0.9 % IV SOLN
100.0000 mg/m2 | Freq: Once | INTRAVENOUS | Status: AC
  Administered 2015-06-24: 220 mg via INTRAVENOUS
  Filled 2015-06-24: qty 11

## 2015-06-25 ENCOUNTER — Inpatient Hospital Stay: Payer: Medicaid Other

## 2015-06-25 VITALS — BP 100/65 | HR 69 | Resp 20

## 2015-06-25 DIAGNOSIS — C3491 Malignant neoplasm of unspecified part of right bronchus or lung: Secondary | ICD-10-CM

## 2015-06-25 DIAGNOSIS — Z5111 Encounter for antineoplastic chemotherapy: Secondary | ICD-10-CM | POA: Diagnosis not present

## 2015-06-25 MED ORDER — SODIUM CHLORIDE 0.9 % IV SOLN
10.0000 mg | Freq: Once | INTRAVENOUS | Status: AC
  Administered 2015-06-25: 10 mg via INTRAVENOUS
  Filled 2015-06-25: qty 1

## 2015-06-25 MED ORDER — PEGFILGRASTIM 6 MG/0.6ML ~~LOC~~ PSKT
6.0000 mg | PREFILLED_SYRINGE | Freq: Once | SUBCUTANEOUS | Status: AC
  Administered 2015-06-25: 6 mg via SUBCUTANEOUS
  Filled 2015-06-25: qty 0.6

## 2015-06-25 MED ORDER — SODIUM CHLORIDE 0.9 % IV SOLN
100.0000 mg/m2 | Freq: Once | INTRAVENOUS | Status: AC
  Administered 2015-06-25: 220 mg via INTRAVENOUS
  Filled 2015-06-25: qty 11

## 2015-06-25 MED ORDER — SODIUM CHLORIDE 0.9 % IV SOLN
Freq: Once | INTRAVENOUS | Status: AC
  Administered 2015-06-25: 14:00:00 via INTRAVENOUS
  Filled 2015-06-25: qty 1000

## 2015-06-25 MED ORDER — HEPARIN SOD (PORK) LOCK FLUSH 100 UNIT/ML IV SOLN
500.0000 [IU] | Freq: Once | INTRAVENOUS | Status: DC | PRN
  Filled 2015-06-25: qty 5

## 2015-06-28 ENCOUNTER — Other Ambulatory Visit: Payer: Self-pay | Admitting: Vascular Surgery

## 2015-06-29 ENCOUNTER — Telehealth: Payer: Self-pay | Admitting: *Deleted

## 2015-06-29 MED ORDER — MAGIC MOUTHWASH W/LIDOCAINE: 10.0000 mL | Freq: Four times a day (QID) | ORAL | Status: DC | PRN

## 2015-06-29 NOTE — Telephone Encounter (Signed)
Asking what can be done about a sore on the roof of his mouth. Also has swelling in the mouth lesion area. Please advise. Having body aches since receiving neulasta, which he got Sunday Denies fever.

## 2015-06-30 ENCOUNTER — Ambulatory Visit
Admission: RE | Admit: 2015-06-30 | Discharge: 2015-06-30 | Disposition: A | Discharge status: Home / Self Care | Payer: Medicaid Other | Source: Ambulatory Visit | Attending: Radiation Oncology | Admitting: Radiation Oncology

## 2015-06-30 ENCOUNTER — Encounter: Payer: Self-pay | Admitting: Radiation Oncology

## 2015-06-30 VITALS — BP 127/78 | HR 72 | Temp 97.7°F | Resp 18 | Wt 212.7 lb

## 2015-06-30 DIAGNOSIS — Z510 Encounter for antineoplastic radiation therapy: Secondary | ICD-10-CM | POA: Insufficient documentation

## 2015-06-30 DIAGNOSIS — C3411 Malignant neoplasm of upper lobe, right bronchus or lung: Secondary | ICD-10-CM | POA: Insufficient documentation

## 2015-06-30 DIAGNOSIS — C7951 Secondary malignant neoplasm of bone: Secondary | ICD-10-CM

## 2015-06-30 DIAGNOSIS — F17210 Nicotine dependence, cigarettes, uncomplicated: Secondary | ICD-10-CM | POA: Insufficient documentation

## 2015-06-30 NOTE — Consult Note (Signed)
Except an outstanding is perfect of Radiation Oncology NEW PATIENT EVALUATION  Name: Chris Mason  MRN: [id number]  Date:   06/30/2015     DOB: 13-Oct-1960   This 55 y.o. 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  REFERRING PHYSICIAN: No ref. provider found  CHIEF COMPLAINT:  Chief Complaint  Patient presents with  . Cancer    Pt is here for bone metastasis    DIAGNOSIS: The encounter diagnosis was Bone metastasis ([place]).   PREVIOUS INVESTIGATIONS:  PET CT scans reviewed Pathology report reviewed Clinical notes reviewed  HPI: Patient is a 55 year old male long-standing smoking history presented with a two-month history of left hip pain causing difficulty ambulating. Pain was in the inguinal region although now radiates down into his left posterior calf. He was evaluated at the clinic clinic earlier this month showing abnormality in the left hip showing a 3.3 cm destructive lesion and left parasymphyseal pubic bone and suprapubic ramus. Chest x-ray confirmed a 3 cm nodular density in the right upper lobe. PET CT scan was performed showing findings consistent with metastatic right upper lobe primary bronchogenic carcinoma with involvement of the liver bone and hypermetabolic activity in the left symphysis pubis. Biopsy of the liver was positive for small cell undifferentiated carcinoma. Patient has been started on carboplatinum etoposide for his first cycle with Neulasta support. He is tolerated this well. He is currently on narcotic analgesics for pain control. I been asked to evaluate him for possible palliative radiation therapy to his left symphysis pubis. Patient is clinically doing well no significant weight loss no cough hemoptysis or chest tightness.  PLANNED TREATMENT REGIMEN: Palliative radiation therapy to left symphysis pubis and left proximal femur  PAST MEDICAL HISTORY:   has a past medical history of Hypertension.    PAST SURGICAL HISTORY:  Past Surgical History  Procedure Laterality Date  . Elbow arthroplasty      FAMILY HISTORY: family history is not on file.  SOCIAL HISTORY:  reports that he has been smoking Cigarettes.  He has a 8.75 pack-year smoking history. He does not have any smokeless tobacco history on file. He reports that he drinks about 1.8 oz of alcohol per week. He reports that he does not use illicit drugs.  ALLERGIES: Review of patient's allergies indicates no known allergies.  MEDICATIONS:  Current Outpatient Prescriptions  Medication Sig Dispense Refill  . fentaNYL (DURAGESIC - DOSED MCG/HR) 25 MCG/HR patch Place 1 patch (25 mcg total) onto the skin every 3 (three) days. 10 patch 0  . LORazepam (ATIVAN) 0.5 MG tablet Take 1 tablet (0.5 mg total) by mouth every 6 (six) hours as needed for anxiety. 30 tablet 0  . losartan-hydrochlorothiazide (HYZAAR) 50-12.5 MG per tablet Take 1 tablet by mouth daily.    . magic mouthwash w/lidocaine SOLN Take 10 mLs by mouth 4 (four) times daily as needed for mouth pain. 480 mL 3  . ondansetron (ZOFRAN) 8 MG tablet Take 1 tablet (8 mg total) by mouth 2 (two) times daily as needed for refractory nausea / vomiting. Start on day 3 after carboplatin chemo. 30 tablet 1  . oxyCODONE (OXY IR/ROXICODONE) 5 MG immediate release tablet Take 1 tablet (5 mg total) by mouth every 4 (four) hours as needed for severe pain. 120 tablet 0  . oxycodone (OXY-IR) 5 MG capsule 1-2 tablets every 4 to 6 hours as needed for pain (Patient  not taking: Reported on 06/30/2015) 40 capsule 0  . oxyCODONE-acetaminophen (PERCOCET) 7.5-325 MG tablet Take 1 tablet by mouth every 4 (four) hours as needed for severe pain. (Patient not taking: Reported on 06/23/2015) 20 tablet 0   No current facility-administered medications for this encounter.    ECOG PERFORMANCE STATUS:  1 - Symptomatic but completely ambulatory  REVIEW OF SYSTEMS:  Except for the left leg pain Patient denies any weight loss, fatigue, weakness, fever, chills or night sweats. Patient denies any loss of vision, blurred vision. Patient denies any ringing  of the ears or hearing loss. No irregular heartbeat. Patient denies heart murmur or history of fainting. Patient denies any chest pain or pain radiating to her upper extremities. Patient denies any shortness of breath, difficulty breathing at night, cough or hemoptysis. Patient denies any swelling in the lower legs. Patient denies any nausea vomiting, vomiting of blood, or coffee ground material in the vomitus. Patient denies any stomach pain. Patient states has had normal bowel movements no significant constipation or diarrhea. Patient denies any dysuria, hematuria or significant nocturia. Patient denies any problems walking, swelling in the joints or loss of balance. Patient denies any skin changes, loss of hair or loss of weight. Patient denies any excessive worrying or anxiety or significant depression. Patient denies any problems with insomnia. Patient denies excessive thirst, polyuria, polydipsia. Patient denies any swollen glands, patient denies easy bruising or easy bleeding. Patient denies any recent infections, allergies or URI. Patient "s visual fields have not changed significantly in recent time.    PHYSICAL EXAM: BP 127/78 mmHg  Pulse 72  Temp(Src) 97.7 F (36.5 C)  Resp 18  Wt 212 lb 11.9 oz (96.5 kg) Well-developed male in NAD. Range of motion of left lower extremities show some decreased strength in his left lower extremity secondary to pain. Proprioception is intact. Range of motion of his lower extremities does not elicit pain. Well-developed well-nourished patient in NAD. HEENT reveals PERLA, EOMI, discs not visualized.  Oral cavity is clear. No oral mucosal lesions are identified. Neck is clear without evidence of cervical or supraclavicular adenopathy. Lungs are clear to A&P. Cardiac examination  is essentially unremarkable with regular rate and rhythm without murmur rub or thrill. Abdomen is benign with no organomegaly or masses noted. Motor sensory and DTR levels are equal and symmetric in the upper and lower extremities. Cranial nerves II through XII are grossly intact. Proprioception is intact. No peripheral adenopathy or edema is identified. No motor or sensory levels are noted. Crude visual fields are within normal range.  LABORATORY DATA: Pathology reports reviewed    RADIOLOGY RESULTS: PET CT scan and plain films reviewed   IMPRESSION: Stage IV small cell lung cancer with metastatic disease to left symphysis pubis and left proximal femur causing pain for palliative radiation therapy.  PLAN: At this time I to go ahead with palliative radiation therapy to his left symphysis pubis and left proximal femur. Would plan on delivering 3000 cGy in 10 fractions. Risks and benefits of treatment including skin reaction fatigue possible alteration of blood counts all were explained in detail to the patient. Depending on his response to chemotherapy may use radiation to touch up his lung or treat prophylactically his whole brain at some point in his treatment course. This also was explained to the patient. I have personally set him up for CT simulation early next week.  I would like to take this opportunity for allowing me to participate in the care of your  patient.Armstead Peaks., MD

## 2015-07-02 ENCOUNTER — Inpatient Hospital Stay: Payer: Medicaid Other

## 2015-07-02 ENCOUNTER — Inpatient Hospital Stay (HOSPITAL_BASED_OUTPATIENT_CLINIC_OR_DEPARTMENT_OTHER): Payer: Medicaid Other | Admitting: Hematology and Oncology

## 2015-07-02 VITALS — BP 130/94 | HR 76 | Temp 98.2°F | Resp 18 | Ht 70.5 in | Wt 212.9 lb

## 2015-07-02 DIAGNOSIS — C3411 Malignant neoplasm of upper lobe, right bronchus or lung: Secondary | ICD-10-CM

## 2015-07-02 DIAGNOSIS — C3491 Malignant neoplasm of unspecified part of right bronchus or lung: Secondary | ICD-10-CM

## 2015-07-02 DIAGNOSIS — C787 Secondary malignant neoplasm of liver and intrahepatic bile duct: Secondary | ICD-10-CM

## 2015-07-02 DIAGNOSIS — R918 Other nonspecific abnormal finding of lung field: Secondary | ICD-10-CM

## 2015-07-02 DIAGNOSIS — I10 Essential (primary) hypertension: Secondary | ICD-10-CM

## 2015-07-02 DIAGNOSIS — M25552 Pain in left hip: Secondary | ICD-10-CM

## 2015-07-02 DIAGNOSIS — C7951 Secondary malignant neoplasm of bone: Secondary | ICD-10-CM

## 2015-07-02 DIAGNOSIS — G893 Neoplasm related pain (acute) (chronic): Secondary | ICD-10-CM

## 2015-07-02 DIAGNOSIS — F419 Anxiety disorder, unspecified: Secondary | ICD-10-CM

## 2015-07-02 DIAGNOSIS — C414 Malignant neoplasm of pelvic bones, sacrum and coccyx: Secondary | ICD-10-CM

## 2015-07-02 DIAGNOSIS — Z79899 Other long term (current) drug therapy: Secondary | ICD-10-CM

## 2015-07-02 DIAGNOSIS — F17210 Nicotine dependence, cigarettes, uncomplicated: Secondary | ICD-10-CM

## 2015-07-02 DIAGNOSIS — Z5111 Encounter for antineoplastic chemotherapy: Secondary | ICD-10-CM | POA: Diagnosis not present

## 2015-07-02 LAB — CBC WITH DIFFERENTIAL/PLATELET
Basophils Absolute: 0.1 10*3/uL (ref 0–0.1)
Basophils Relative: 1 %
Eosinophils Absolute: 0.1 10*3/uL (ref 0–0.7)
Eosinophils Relative: 1 %
HCT: 40.2 % (ref 40.0–52.0)
Hemoglobin: 13.7 g/dL (ref 13.0–18.0)
Lymphocytes Relative: 15 %
Lymphs Abs: 1.8 10*3/uL (ref 1.0–3.6)
MCH: 29.4 pg (ref 26.0–34.0)
MCHC: 34 g/dL (ref 32.0–36.0)
MCV: 86.4 fL (ref 80.0–100.0)
Monocytes Absolute: 1.7 10*3/uL — ABNORMAL HIGH (ref 0.2–1.0)
Monocytes Relative: 14 %
Neutro Abs: 8.5 10*3/uL — ABNORMAL HIGH (ref 1.4–6.5)
Neutrophils Relative %: 69 %
Platelets: 221 10*3/uL (ref 150–440)
RBC: 4.65 MIL/uL (ref 4.40–5.90)
RDW: 13 % (ref 11.5–14.5)
WBC: 12.2 10*3/uL — ABNORMAL HIGH (ref 3.8–10.6)

## 2015-07-02 LAB — COMPREHENSIVE METABOLIC PANEL
ALT: 23 U/L (ref 17–63)
AST: 16 U/L (ref 15–41)
Albumin: 3.9 g/dL (ref 3.5–5.0)
Alkaline Phosphatase: 114 U/L (ref 38–126)
Anion gap: 6 (ref 5–15)
BUN: 19 mg/dL (ref 6–20)
CO2: 24 mmol/L (ref 22–32)
Calcium: 8.8 mg/dL — ABNORMAL LOW (ref 8.9–10.3)
Chloride: 104 mmol/L (ref 101–111)
Creatinine, Ser: 0.73 mg/dL (ref 0.61–1.24)
GFR calc Af Amer: >60 mL/min (ref >60)
GFR calc non Af Amer: >60 mL/min (ref >60)
Glucose, Bld: 110 mg/dL — ABNORMAL HIGH (ref 65–99)
Potassium: 3.9 mmol/L (ref 3.5–5.1)
Sodium: 134 mmol/L — ABNORMAL LOW (ref 135–145)
Total Bilirubin: 0.6 mg/dL (ref 0.3–1.2)
Total Protein: 7.4 g/dL (ref 6.5–8.1)

## 2015-07-02 NOTE — Progress Notes (Signed)
Chris Mason is a 55 y.o. male with extensive stage small cell lung cancer who is seen for nadir assessment on day 10 s/p carboplatin and etoposide.  HPI:   The patient was last seen by me in the medical oncology clinic on 06/18/2015.  At that time, head MRI and liver biopsy were reviewed.  We discussed treatment plan with carboplatin and etoposide every 3 weeks for 4-6 cycles.  We discussed Xgeva for bone metastasis.  He was seen by Dr. Delight Hoh on 06/23/2015 prior to initiation of cycle #1 carboplatin and etoposide.  He received Neulasta support.  He received Xgeva.  He tolerated his chemotherapy well.  He denies any nausea or vomiting.  He notes some mild fatigue in the evening.  He noted some body aches on Sunday and Monday.  He took Claritin to prevent Neulasta induced bone pain.  He met with Dr. Baruch Gouty.  He states that he is scheduled to undergo simulation on Tuesday, 07/06/2015.  He is scheduled for port-a-cath placement on 07/05/2015.    Symptomatically, he has decreased left hip pain.  Pain previously was a 10 and is now 5-6.  He notices his pain when moving around.  He is taking oxycodone 5 mg every 4 hours as needed for pain. He is on a Fentanyl 25 mcg/hour patch.  Past Medical History  Diagnosis Date  . Hypertension     Past Surgical History  Procedure Laterality Date  . Elbow arthroplasty      No family history on file.  Social History:  reports that he has been smoking Cigarettes.  He has a 8.75 pack-year smoking history. He does not have any smokeless tobacco history on file. He reports that he drinks about 1.8 oz of alcohol per week. He reports that he does not use illicit drugs.  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  The patient is accompanied by his girlfriend today.  Allergies: No Known  Allergies  Current Medications: Current Outpatient Prescriptions  Medication Sig Dispense Refill  . fentaNYL (DURAGESIC - DOSED MCG/HR) 25 MCG/HR patch Place 1 patch (25 mcg total) onto the skin every 3 (three) days. 10 patch 0  . LORazepam (ATIVAN) 0.5 MG tablet Take 1 tablet (0.5 mg total) by mouth every 6 (six) hours as needed for anxiety. 30 tablet 0  . losartan-hydrochlorothiazide (HYZAAR) 50-12.5 MG per tablet Take 1 tablet by mouth daily.    . magic mouthwash w/lidocaine SOLN Take 10 mLs by mouth 4 (four) times daily as needed for mouth pain. 480 mL 3  . ondansetron (ZOFRAN) 8 MG tablet Take 1 tablet (8 mg total) by mouth 2 (two) times daily as needed for refractory nausea / vomiting. Start on day 3 after carboplatin chemo. 30 tablet 1  . oxyCODONE (OXY IR/ROXICODONE) 5 MG immediate release tablet Take 1 tablet (5 mg total) by mouth every 4 (four) hours as needed for severe pain. 120 tablet 0  . oxycodone (OXY-IR) 5 MG capsule 1-2 tablets every 4 to 6 hours as needed for pain 40 capsule 0  . oxyCODONE-acetaminophen (PERCOCET) 7.5-325 MG tablet Take 1 tablet by mouth every 4 (four) hours as needed for severe pain. 20 tablet 0   No current facility-administered medications for this visit.   Review of Systems:  GENERAL:  Feels better.  Mild fatigue in  the evening.  No fevers or sweats.  Weight loss of 1 pound. PERFORMANCE STATUS (ECOG):  1 HEENT:  No visual changes, runny nose, sore throat, mouth sores or tenderness. Lungs: Little wheezing at night.  No shortness of breath or cough.  No hemoptysis. Cardiac:  No chest pain, palpitations, orthopnea, or PND. GI:  No nausea, vomiting, diarrhea, constipation, melena or hematochezia.  No prior colonoscopy. GU:  No urgency, frequency, dysuria, or hematuria. Musculoskeletal:  Left hip pain, improved.  Left rib pain and back pain, improved.  No joint pain.  No muscle tenderness. Extremities:  No pain or swelling. Skin:  No rashes, skin changes,  or ulcers. Neuro:  No headaches, numbness or weakness, balance or coordination issues. Endocrine:  No diabetes, thyroid issues, hot flashes or night sweats. Psych:  Anxiety.  No mood changes or depression. Pain:  No focal pain. Review of systems:  All other systems reviewed and found to be negative.  Physical Exam: Blood pressure 130/94, pulse 76, temperature 98.2 F (36.8 C), temperature source Tympanic, resp. rate 18, height 5' 10.5" (1.791 m), weight 212 lb 13.7 oz (96.55 kg). GENERAL:  Well developed, well nourished, moving about in the exam room in no acute distress.  He is anxious. MENTAL STATUS:  Alert and oriented to person, place and time. HEAD:  Short brown hair with graying goatee.  Normocephalic, atraumatic, face symmetric, no Cushingoid features. EYES:  Pupils equal round and reactive to light and accomodation.  No conjunctivitis or scleral icterus. ENT:  Oropharynx clear without lesion.  Tongue normal. Mucous membranes moist.  RESPIRATORY:  Clear to auscultation without rales, wheezes or rhonchi. CARDIOVASCULAR:  Regular rate and rhythm without murmur, rub or gallop. ABDOMEN:  Soft, non-tender, with active bowel sounds, and no hepatosplenomegaly.  No masses. SKIN:  No rashes, ulcers or lesions. EXTREMITIES: No edema, no skin discoloration or tenderness.  No palpable cords. LYMPH NODES: No palpable cervical, supraclavicular, axillary or inguinal adenopathy  NEUROLOGICAL: Unremarkable. PSYCH:  Appropriate.   Appointment on 07/02/2015  Component Date Value Ref Range Status  . WBC 07/02/2015 12.2* 3.8 - 10.6 K/uL Final  . RBC 07/02/2015 4.65  4.40 - 5.90 MIL/uL Final  . Hemoglobin 07/02/2015 13.7  13.0 - 18.0 g/dL Final  . HCT 07/02/2015 40.2  40.0 - 52.0 % Final  . MCV 07/02/2015 86.4  80.0 - 100.0 fL Final  . MCH 07/02/2015 29.4  26.0 - 34.0 pg Final  . MCHC 07/02/2015 34.0  32.0 - 36.0 g/dL Final  . RDW 07/02/2015 13.0  11.5 - 14.5 % Final  . Platelets 07/02/2015 221   150 - 440 K/uL Final  . Neutrophils Relative % 07/02/2015 69   Final  . Neutro Abs 07/02/2015 8.5* 1.4 - 6.5 K/uL Final  . Lymphocytes Relative 07/02/2015 15   Final  . Lymphs Abs 07/02/2015 1.8  1.0 - 3.6 K/uL Final  . Monocytes Relative 07/02/2015 14   Final  . Monocytes Absolute 07/02/2015 1.7* 0.2 - 1.0 K/uL Final  . Eosinophils Relative 07/02/2015 1   Final  . Eosinophils Absolute 07/02/2015 0.1  0 - 0.7 K/uL Final  . Basophils Relative 07/02/2015 1   Final  . Basophils Absolute 07/02/2015 0.1  0 - 0.1 K/uL Final  . Sodium 07/02/2015 134* 135 - 145 mmol/L Final  . Potassium 07/02/2015 3.9  3.5 - 5.1 mmol/L Final  . Chloride 07/02/2015 104  101 - 111 mmol/L Final  . CO2 07/02/2015 24  22 - 32 mmol/L Final  .  Glucose, Bld 07/02/2015 110* 65 - 99 mg/dL Final  . BUN 07/02/2015 19  6 - 20 mg/dL Final  . Creatinine, Ser 07/02/2015 0.73  0.61 - 1.24 mg/dL Final  . Calcium 07/02/2015 8.8* 8.9 - 10.3 mg/dL Final  . Total Protein 07/02/2015 7.4  6.5 - 8.1 g/dL Final  . Albumin 07/02/2015 3.9  3.5 - 5.0 g/dL Final  . AST 07/02/2015 16  15 - 41 U/L Final  . ALT 07/02/2015 23  17 - 63 U/L Final  . Alkaline Phosphatase 07/02/2015 114  38 - 126 U/L Final  . Total Bilirubin 07/02/2015 0.6  0.3 - 1.2 mg/dL Final  . GFR calc non Af Amer 07/02/2015 >60  >60 mL/min Final  . GFR calc Af Amer 07/02/2015 >60  >60 mL/min Final   Comment: (NOTE) The eGFR has been calculated using the CKD EPI equation. This calculation has not been validated in all clinical situations. eGFR's persistently <60 mL/min signify possible Chronic Kidney Disease.   . Anion gap 07/02/2015 6  5 - 15 Final    Assessment:  MERIK MIGNANO is a 55 y.o. male with extensive stage small cell lung cancer.  He presented with a 2 month history of left hip pain.  CXR revealed a nodular density in the right upper lobe.  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    CT scan of the left hip on  06/04/2015 revealed a 3.3 x 2.5 cm destructive lesion in the left parasymphyseal pubic bone and superior pubic ramus.  Surrounding sift tissues demonstrated a low-attenuation soft tissue component of the lesion.  There was subtle lucency of the left greater trochanter and a small focus of cortical destruction along the anterior aspect of the greater trochanter.  There was also lucency in the periphery of the left femoral head without cortical disruption.  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 There was a dominant 2.0 x 1.8 cm right upper lobe pulmonary nodule (SUV 8.9) with thoracic nodal, hepatic, and multifocal osseous metastasis.  Examples within the left pubic bone at a SUV of 11.3, left transverse process at T7 (SUV of 8.9), C4 vertebral body (SUV of 5.7), lateral eighth left rib (SUV of 6.3.).  There was a 3.8 cm right hepatic lobe hypo-attenuating mass (SUV 8.4).  CEA was 912.6 on 06/23/2015.  Head MRI on 06/16/2015 revealed no evidence for metastatic disease.   He is currently day 10 s/p cycle #1 carboplatin and etoposide (06/22/2105).  Counts are good (ANC is 8500).  He received Xgeva on 06/23/2015.  Symptomatically, he feels good.  Hip pain is less.  Plan: 1.  Labs today:  CBC with diff, CMP. 2.  Discuss current status.  Discuss improved pain.  Discuss pain management.  Discuss 2nd cycle of chemotherapy beginning 07/14/2015.  Discuss patient's conversations with radiation oncology.  Discuss timing of radiation with Dr. Baruch Gouty as pain improved.  Discuss continuation of Xgeva every 28 days.  Discuss reimaging after 2 cycles. 3.  Anticipate port-a-cath placement on 07/05/2015 per patient.  Ensure counts good (day 14). 4.  Re-review fever and neutropenia precautions. 5.  RTC on 07/13/2015 for MD assessment, labs (CBC with diff, CMP, Mg, CEA, LDH) with cycle #2 carboplatin and etoposide beginning  07/14/2015.   Lequita Asal, MD  07/02/2015

## 2015-07-02 NOTE — Progress Notes (Signed)
Pt reports having a cough and not sure if its related to allergies and wheezing at night.  Coughing up thick, yellow/green, moderate amount mucous.  Pt reports having body aches on Sunday and Monday and the highest fever was 100.1.  Pt reports mild fatigue in the evenings

## 2015-07-05 ENCOUNTER — Telehealth: Payer: Self-pay | Admitting: Hematology and Oncology

## 2015-07-05 ENCOUNTER — Other Ambulatory Visit: Payer: Self-pay | Admitting: Hematology and Oncology

## 2015-07-05 ENCOUNTER — Telehealth: Payer: Self-pay

## 2015-07-05 ENCOUNTER — Encounter: Payer: Self-pay | Admitting: Hematology and Oncology

## 2015-07-05 DIAGNOSIS — C3490 Malignant neoplasm of unspecified part of unspecified bronchus or lung: Secondary | ICD-10-CM

## 2015-07-05 MED ORDER — ONDANSETRON HCL 4 MG/2ML IJ SOLN: 4.0000 mg | Freq: Four times a day (QID) | INTRAMUSCULAR | Status: DC | PRN

## 2015-07-05 MED ORDER — HYDROMORPHONE HCL 1 MG/ML IJ SOLN: 1.0000 mg | Freq: Once | INTRAMUSCULAR | Status: DC

## 2015-07-05 MED ORDER — DEXTROSE 5 % IV SOLN
1.5000 g | INTRAVENOUS | Status: AC
  Administered 2015-07-08: 1.5 g via INTRAVENOUS

## 2015-07-05 MED ORDER — SODIUM CHLORIDE 0.9 % IV SOLN
INTRAVENOUS | Status: DC
  Administered 2015-07-08: 08:00:00 via INTRAVENOUS

## 2015-07-05 NOTE — Progress Notes (Signed)
[facility] day:  06/18/2015  Chief Complaint: Chris Mason is a 55 y.o. male with a right lung mass and lytic lesion in the pubic bone who is seen for review of interval head MRI and liver biopsy and discussion regarding direction of therapy.  HPI:   Patient returns to clinic today to initiate cycle 1, day 1 of carboplatinum and etoposide. Other than persistent left hip pain he feels well and is at his baseline. He denies any fevers. He has a good appetite and denies weight loss. He denies any chest pain or shortness of breath. He denies any nausea, vomiting, constipation, or diarrhea. He has no urinary complaints. Patient otherwise feels well and offers no further specific complaints.   Past Medical History  Diagnosis Date  . Hypertension     Past Surgical History  Procedure Laterality Date  . Elbow arthroplasty      No family history on file.  Social History:  reports that he has been smoking Cigarettes.  He has a 8.75 pack-year smoking history. He does not have any smokeless tobacco history on file. He reports that he drinks about 1.8 oz of alcohol per week. He reports that he does not use illicit drugs.  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  The patient is accompanied by several family members (wife, girlfriend, and mother).  Allergies: No Known Allergies  Current Medications: Current Outpatient Prescriptions  Medication Sig Dispense Refill  . LORazepam (ATIVAN) 0.5 MG tablet Take 1 tablet (0.5 mg total) by mouth every 6 (six) hours as needed for anxiety. 30 tablet 0  . losartan-hydrochlorothiazide (HYZAAR) 50-12.5 MG per tablet Take 1 tablet by mouth daily.    . ondansetron (ZOFRAN) 8 MG tablet Take 1 tablet (8 mg total) by mouth 2 (two) times daily as needed for refractory nausea / vomiting. Start on day 3 after carboplatin chemo. 30 tablet 1  . oxyCODONE (OXY  IR/ROXICODONE) 5 MG immediate release tablet Take 1 tablet (5 mg total) by mouth every 4 (four) hours as needed for severe pain. 120 tablet 0  . oxycodone (OXY-IR) 5 MG capsule 1-2 tablets every 4 to 6 hours as needed for pain 40 capsule 0  . fentaNYL (DURAGESIC - DOSED MCG/HR) 25 MCG/HR patch Place 1 patch (25 mcg total) onto the skin every 3 (three) days. 10 patch 0  . magic mouthwash w/lidocaine SOLN Take 10 mLs by mouth 4 (four) times daily as needed for mouth pain. 480 mL 3  . oxyCODONE-acetaminophen (PERCOCET) 7.5-325 MG tablet Take 1 tablet by mouth every 4 (four) hours as needed for severe pain. 20 tablet 0   No current facility-administered medications for this visit.   Facility-Administered Medications Ordered in Other Visits  Medication Dose Route Frequency Provider Last Rate Last Dose  . 0.9 %  sodium chloride infusion   Intravenous Continuous Kimberly A Stegmayer, PA-C      . cefUROXime (ZINACEF) 1.5 g in dextrose 5 % 50 mL IVPB  1.5 g Intravenous 30 min Pre-Op Janalyn Harder Stegmayer, PA-C      . HYDROmorphone (DILAUDID) injection 1 mg  1 mg Intravenous Once [employer], PA-C      . ondansetron (ZOFRAN) injection 4 mg  4 mg Intravenous Q6H PRN Sela Hua, PA-C       Review of Systems:  GENERAL:  Feels fine.  No fevers, sweats  or weight loss. PERFORMANCE STATUS (ECOG):  1 HEENT:  No visual changes, runny nose, sore throat, mouth sores or tenderness. Lungs: Little cough in the morning, sometimes at night.  No shortness of breath or cough.  No hemoptysis. Cardiac:  No chest pain, palpitations, orthopnea, or PND. GI:  No nausea, vomiting, diarrhea, constipation, melena or hematochezia.  No prior colonoscopy. GU:  No urgency, frequency, dysuria, or hematuria. Musculoskeletal:  Left hip pain.  Left rib pain.  Back pain.  No joint pain.  No muscle tenderness. Extremities:  No pain or swelling. Skin:  No rashes, skin changes, or ulcers. Neuro:  No headaches, numbness  or weakness, balance or coordination issues. Endocrine:  No diabetes, thyroid issues, hot flashes or night sweats. Psych:  Anxiety.  No mood changes or depression. Pain:  No focal pain. Review of systems:  All other systems reviewed and found to be negative.  Physical Exam: Blood pressure 123/74, pulse 82, temperature 97.7 F (36.5 C), temperature source Tympanic, resp. rate 18, height 5' 10.5" (1.791 m), weight 214 lb 1.1 oz (97.1 kg). GENERAL:  Well developed, well nourished, moving about in the exam room in no acute distress.  He is anxious. MENTAL STATUS:  Alert and oriented to person, place and time. HEAD:  Short brown hair with graying goatee.  Normocephalic, atraumatic, face symmetric, no Cushingoid features. EYES: No conjunctivitis or scleral icterus. NEUROLOGICAL:  Appropriate.  Walks with a slight limp. PSYCH:  Appropriate.    Appointment on 06/23/2015  Component Date Value Ref Range Status  . WBC 06/23/2015 11.6* 3.8 - 10.6 K/uL Final  . RBC 06/23/2015 5.33  4.40 - 5.90 MIL/uL Final  . Hemoglobin 06/23/2015 16.1  13.0 - 18.0 g/dL Final  . HCT 06/23/2015 46.0  40.0 - 52.0 % Final  . MCV 06/23/2015 86.2  80.0 - 100.0 fL Final  . MCH 06/23/2015 30.1  26.0 - 34.0 pg Final  . MCHC 06/23/2015 34.9  32.0 - 36.0 g/dL Final  . RDW 06/23/2015 13.0  11.5 - 14.5 % Final  . Platelets 06/23/2015 298  150 - 440 K/uL Final  . Neutrophils Relative % 06/23/2015 64   Final  . Neutro Abs 06/23/2015 7.5* 1.4 - 6.5 K/uL Final  . Lymphocytes Relative 06/23/2015 23   Final  . Lymphs Abs 06/23/2015 2.6  1.0 - 3.6 K/uL Final  . Monocytes Relative 06/23/2015 10   Final  . Monocytes Absolute 06/23/2015 1.1* 0.2 - 1.0 K/uL Final  . Eosinophils Relative 06/23/2015 2   Final  . Eosinophils Absolute 06/23/2015 0.2  0 - 0.7 K/uL Final  . Basophils Relative 06/23/2015 1   Final  . Basophils Absolute 06/23/2015 0.1  0 - 0.1 K/uL Final  . Sodium 06/23/2015 135  135 - 145 mmol/L Final  . Potassium  06/23/2015 3.7  3.5 - 5.1 mmol/L Final  . Chloride 06/23/2015 103  101 - 111 mmol/L Final  . CO2 06/23/2015 25  22 - 32 mmol/L Final  . Glucose, Bld 06/23/2015 121* 65 - 99 mg/dL Final  . BUN 06/23/2015 21* 6 - 20 mg/dL Final  . Creatinine, Ser 06/23/2015 0.92  0.61 - 1.24 mg/dL Final  . Calcium 06/23/2015 9.3  8.9 - 10.3 mg/dL Final  . Total Protein 06/23/2015 8.0  6.5 - 8.1 g/dL Final  . Albumin 06/23/2015 4.1  3.5 - 5.0 g/dL Final  . AST 06/23/2015 21  15 - 41 U/L Final  . ALT 06/23/2015 27  17 - 63 U/L Final  .  Alkaline Phosphatase 06/23/2015 81  38 - 126 U/L Final  . Total Bilirubin 06/23/2015 1.2  0.3 - 1.2 mg/dL Final  . GFR calc non Af Amer 06/23/2015 >60  >60 mL/min Final  . GFR calc Af Amer 06/23/2015 >60  >60 mL/min Final   Comment: (NOTE) The eGFR has been calculated using the CKD EPI equation. This calculation has not been validated in all clinical situations. eGFR's persistently <60 mL/min signify possible Chronic Kidney Disease.   . Anion gap 06/23/2015 7  5 - 15 Final  . LDH 06/23/2015 205* 98 - 192 U/L Final  . Magnesium 06/23/2015 2.1  1.7 - 2.4 mg/dL Final  . Prothrombin Time 06/23/2015 13.9  11.4 - 15.0 seconds Final  . INR 06/23/2015 1.05   Final  . CEA 06/23/2015 912.6* 0.0 - 4.7 ng/mL Final   Comment: (NOTE)       Roche ECLIA methodology       Nonsmokers  <3.9                                     Smokers     <5.6 Performed At: [facility] [address] [place], [place] [id number] Lindon Romp MD ID:[id number]     Assessment:  Chris Mason is a 55 y.o. male with extensive stage small cell lung cancer.  He presented with a 2 month history of left hip pain.  CXR revealed a nodular density in the right upper lobe.  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    CT scan of the left hip on 06/04/2015 revealed a 3.3 x 2.5 cm destructive lesion in the left parasymphyseal pubic bone and superior pubic  ramus.  Surrounding sift tissues demonstrated a low-attenuation soft tissue component of the lesion.  There was subtle lucency of the left greater trochanter and a small focus of cortical destruction along the anterior aspect of the greater trochanter.  There was also lucency in the periphery of the left femoral head without cortical disruption.  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 There was a dominant 2.0 x 1.8 cm right upper lobe pulmonary nodule (SUV 8.9) with thoracic nodal, hepatic, and multifocal osseous metastasis.  Examples within the left pubic bone at a SUV of 11.3, left transverse process at T7 (SUV of 8.9), C4 vertebral body (SUV of 5.7), lateral eighth left rib (SUV of 6.3.).  There was a 3.8 cm right hepatic lobe hypo-attenuating mass (SUV 8.4).  Head MRI on 06/16/2015 revealed no evidence for metastatic disease.   Symptomatically, he feels well except for left hip pain.  Exam reveals tenderness and decreased range of motion in the hip secondary to pain.  Neurologic exam is normal.  He remains anxious.  Plan: 1.  Small cell lung cancer: Proceed with cycle 1, day 1 of carboplatinum and etoposide today. Patient will return to clinic in 1 and 2 days for etoposide only and then in 1 week for further evaluation. Plan to get treatment every 21 days with Neulasta support. Previously the side effects of chemotherapy were discussed including myelosuppression, nausea, vomiting, hair loss, electrolytes wasting (potassium and magnesium), hypotension and small risk of leukemia (with etoposide).  Discuss risk of infection when counts are low (neutropenia).  Discuss consideration of port-a-cath placement.  Discuss nursing to assess veins for peripheral chemotherapy administration.  Patient wishes 1st cycle via PIV.  Discuss reassessment every 2 cycles with imaging (PET after 4th cycle).  Discuss possible prophylactic  cranial irradiation (PCI) if excellent response to chemotherapy. 3. Xgeva monthly given metastatic bone lesions to prevent bone related events. 4.  Discuss pain management.  Discuss keeping pain dairy with plan to switch to long acting pain medication (Oxycontin or Fentanyl patch) based on use of prn pain medications. Patient was also given a referral to radiation oncology. 5.  Discuss follow-up with Elease Etienne, social worker, as patient self pay. 6.  Rx:  Ativan 0.5 mg po q 6 hrs prn anxiety.    Lloyd Huger, MD  06/18/2015

## 2015-07-05 NOTE — Telephone Encounter (Signed)
Re:  Intermittent low grade temperatures and mild achiness  Patient called several times over weekend.  He has felt good.  He notes mild achiness.  Temperature has fluctuated between 99 and 100.2.  He purchased a second thermometer to take his temperature.  He has been quite active.  As he was feeling well and not much different then he had over the past 4 days (patient seen in clinic on Friday), we discussed continued observation.   He is at his nadir.  Counts on Friday were good.  He would like to have his counts checked on Monday, 07/05/2015, and possibly be seen if he is sick.  He was told to call back over the weekend if he felt sick, fever 100.4 or higher or chills.  He did not feel comfortable proceeding with port-a-cath placement on Monday and was going to cancel his surgery.  Since he had notified Dr. Angie Fava office already, I suggested port placement just prior to his second cycle of chemotherapy.  Lequita Asal, MD

## 2015-07-05 NOTE — Telephone Encounter (Signed)
Called Pt x2 today and spoke with pt greater than 30 minutes.  Pt reports that he runs a low grade fever in the evenings nothing greater than 100.2 and some achiness.  Pt reports that every day he feels better and better and in the day time feels normal.  I asked pt does he have a fever currently and pt stated no.  I asked if he feels bad with any achiness, pt stated no.  Pt stated he wheezes at nighttime or in the evening and I asked pt to do some deep breathing in the evening.  I asked pt if he felt worse or if his fever worsens to call on call or go to the ER.  Pt reports that he is outside playing with a puppy currently and wouldn't know he had cancer if he had not been told.  He feels normal other than some soreness from riding his bike this morning up the road with a mask on.  Pt reports that he would like his anxiety medication and pain medication refilled tomorrow when MD is back that he has enough for tonight.  Pt asks again if he may live more time than what is normal or statistically normal.  Again, speaking with the pt I informed him as MD has also there is no way for [place] to know how long he may live but we have to give him the statistics.  I told him he is young.  Cope with this his best way possible and fight for it.  Pt verbalized a thanks and stated thank you for taking so much times with him today.

## 2015-07-06 ENCOUNTER — Telehealth: Payer: Self-pay | Admitting: *Deleted

## 2015-07-06 ENCOUNTER — Ambulatory Visit
Admission: RE | Admit: 2015-07-06 | Discharge: 2015-07-06 | Disposition: A | Discharge status: Home / Self Care | Payer: Medicaid Other | Source: Ambulatory Visit | Attending: Radiation Oncology | Admitting: Radiation Oncology

## 2015-07-06 DIAGNOSIS — C3411 Malignant neoplasm of upper lobe, right bronchus or lung: Secondary | ICD-10-CM | POA: Diagnosis not present

## 2015-07-06 DIAGNOSIS — F17210 Nicotine dependence, cigarettes, uncomplicated: Secondary | ICD-10-CM | POA: Diagnosis not present

## 2015-07-06 DIAGNOSIS — Z510 Encounter for antineoplastic radiation therapy: Secondary | ICD-10-CM | POA: Diagnosis not present

## 2015-07-06 MED ORDER — OXYCODONE HCL 5 MG PO TABS: 5.0000 mg | ORAL_TABLET | ORAL | Status: DC | PRN

## 2015-07-06 MED ORDER — LORAZEPAM 0.5 MG PO TABS: 0.5000 mg | ORAL_TABLET | Freq: Four times a day (QID) | ORAL | Status: DC | PRN

## 2015-07-06 NOTE — Telephone Encounter (Signed)
Called pt to let him know rx ready to be picked up

## 2015-07-06 NOTE — Telephone Encounter (Signed)
Patient is to keep XRT appt as planned. Patient informed

## 2015-07-06 NOTE — Telephone Encounter (Signed)
Need to call in some prescriptions was told call and remind her to call them in to pharmacy. Asking if he is to keep XRT appt today or does it need to be cancelled. Needs refill on his Lorazepam, Oxycodone he states he dropped half of them  In the sink so he needs a refill ( got #120 tabs on 3/22). Temp is running 100 last night now 98.5

## 2015-07-07 ENCOUNTER — Other Ambulatory Visit: Payer: Self-pay | Admitting: Vascular Surgery

## 2015-07-07 ENCOUNTER — Telehealth: Payer: Self-pay | Admitting: *Deleted

## 2015-07-07 ENCOUNTER — Telehealth: Payer: Self-pay | Admitting: Hematology and Oncology

## 2015-07-07 NOTE — Telephone Encounter (Signed)
Dr. Mike Gip called and spoke to his mother and radiation is cancelled for now. Radiation has been notified

## 2015-07-07 NOTE — Telephone Encounter (Signed)
Erroneous note counter

## 2015-07-07 NOTE — Telephone Encounter (Signed)
  Discussed with patient his thoughts about radiation. Pain dramatically better.  He would like to postpone radiation for now.  Please let radiation therapy know.  M

## 2015-07-07 NOTE — Telephone Encounter (Signed)
I called and spoke to Chris Mason and she will tell radiation that pt not coming any longer per dr Patsy Baltimore

## 2015-07-07 NOTE — Telephone Encounter (Signed)
Chris Mason said to please call her bc she has an important question about Phyllis but she did not want to elaborate. Thank you. [phone]

## 2015-07-07 NOTE — Telephone Encounter (Signed)
Dr. Mike Gip called mother and pt about the concerns

## 2015-07-07 NOTE — Telephone Encounter (Signed)
Requests Dr Mike Gip call her because [place] has questions regarding XRT, Maudie Mercury had called and spoke with her, but she wants Dr Mike Gip to call her

## 2015-07-08 ENCOUNTER — Encounter
Admission: RE | Disposition: A | Discharge status: Home / Self Care | Payer: Self-pay | Source: Ambulatory Visit | Attending: Vascular Surgery

## 2015-07-08 ENCOUNTER — Ambulatory Visit
Admission: RE | Admit: 2015-07-08 | Discharge: 2015-07-08 | Disposition: A | Discharge status: Home / Self Care | Payer: Medicaid Other | Source: Ambulatory Visit | Attending: Vascular Surgery | Admitting: Vascular Surgery

## 2015-07-08 DIAGNOSIS — C3490 Malignant neoplasm of unspecified part of unspecified bronchus or lung: Secondary | ICD-10-CM | POA: Insufficient documentation

## 2015-07-08 DIAGNOSIS — M25552 Pain in left hip: Secondary | ICD-10-CM | POA: Insufficient documentation

## 2015-07-08 DIAGNOSIS — C7951 Secondary malignant neoplasm of bone: Secondary | ICD-10-CM | POA: Diagnosis not present

## 2015-07-08 DIAGNOSIS — F1010 Alcohol abuse, uncomplicated: Secondary | ICD-10-CM | POA: Insufficient documentation

## 2015-07-08 DIAGNOSIS — F17210 Nicotine dependence, cigarettes, uncomplicated: Secondary | ICD-10-CM | POA: Insufficient documentation

## 2015-07-08 DIAGNOSIS — Z79891 Long term (current) use of opiate analgesic: Secondary | ICD-10-CM | POA: Insufficient documentation

## 2015-07-08 DIAGNOSIS — Z9889 Other specified postprocedural states: Secondary | ICD-10-CM | POA: Diagnosis not present

## 2015-07-08 HISTORY — DX: Malignant (primary) neoplasm, unspecified: C80.1

## 2015-07-08 HISTORY — PX: PERIPHERAL VASCULAR CATHETERIZATION: SHX172C

## 2015-07-08 HISTORY — DX: Thyrotoxicosis, unspecified without thyrotoxic crisis or storm: E05.90

## 2015-07-08 SURGERY — PORTA CATH INSERTION
Anesthesia: Moderate Sedation

## 2015-07-08 MED ORDER — MIDAZOLAM HCL 2 MG/2ML IJ SOLN
INTRAMUSCULAR | Status: DC | PRN
  Administered 2015-07-08: 2 mg via INTRAVENOUS
  Administered 2015-07-08 (×3): 1 mg via INTRAVENOUS

## 2015-07-08 MED ORDER — HEPARIN (PORCINE) IN NACL 2-0.9 UNIT/ML-% IJ SOLN
INTRAMUSCULAR | Status: AC
  Filled 2015-07-08: qty 500

## 2015-07-08 MED ORDER — LIDOCAINE-EPINEPHRINE (PF) 1 %-1:200000 IJ SOLN
INTRAMUSCULAR | Status: AC
  Filled 2015-07-08: qty 30

## 2015-07-08 MED ORDER — FENTANYL CITRATE (PF) 100 MCG/2ML IJ SOLN
INTRAMUSCULAR | Status: DC | PRN
  Administered 2015-07-08 (×4): 50 ug via INTRAVENOUS

## 2015-07-08 MED ORDER — SODIUM CHLORIDE 0.9 % IR SOLN
Freq: Once | Status: DC
  Filled 2015-07-08: qty 2

## 2015-07-08 MED ORDER — MIDAZOLAM HCL 5 MG/5ML IJ SOLN
INTRAMUSCULAR | Status: AC
  Filled 2015-07-08: qty 5

## 2015-07-08 MED ORDER — LIDOCAINE-EPINEPHRINE (PF) 1 %-1:200000 IJ SOLN
INTRAMUSCULAR | Status: DC | PRN
  Administered 2015-07-08: 20 mL via INTRADERMAL

## 2015-07-08 MED ORDER — FENTANYL CITRATE (PF) 100 MCG/2ML IJ SOLN
INTRAMUSCULAR | Status: AC
  Filled 2015-07-08: qty 2

## 2015-07-08 MED ORDER — HEPARIN (PORCINE) IN NACL 2-0.9 UNIT/ML-% IJ SOLN
INTRAMUSCULAR | Status: AC
Start: 2015-07-08 — End: 2015-07-08
  Filled 2015-07-08: qty 500

## 2015-07-08 SURGICAL SUPPLY — 10 items
BAG DECANTER STRL (MISCELLANEOUS) ×3 IMPLANT
KIT PORT POWER 8FR ISP CVUE (Catheter) ×3 IMPLANT
PACK ANGIOGRAPHY (CUSTOM PROCEDURE TRAY) ×3 IMPLANT
PAD GROUND ADULT SPLIT (MISCELLANEOUS) ×3 IMPLANT
PENCIL ELECTRO HAND CTR (MISCELLANEOUS) ×3 IMPLANT
PREP CHG 10.5 TEAL (MISCELLANEOUS) ×3 IMPLANT
SUT MNCRL AB 4-0 PS2 18 (SUTURE) ×3 IMPLANT
SUT PROLENE 0 CT 1 30 (SUTURE) ×3 IMPLANT
SUTURE VIC 3-0 (SUTURE) ×3 IMPLANT
TOWEL OR 17X26 4PK STRL BLUE (TOWEL DISPOSABLE) ×3 IMPLANT

## 2015-07-08 NOTE — Op Note (Addendum)
      [facility]       Operative Note  Date: 07/08/2015  Preoperative diagnosis:  1. Lung cancer  Postoperative diagnosis:  Same as above  Procedures: #1. Ultrasound guidance for vascular access to the right internal jugular vein. #2. Fluoroscopic guidance for placement of catheter. #3. Placement of CT compatible Port-A-Cath, right internal jugular vein.  Surgeon: Leotis Pain, MD.   Anesthesia: Local with moderate conscious sedation for approximately 25 minutes using 5 mg of Versed and 200 mcg of Fentanyl  Fluoroscopy time: less than 1 minute  Contrast used: 0  Estimated blood loss: minimal  Indication for the procedure:  The patient is a 55 y.o.male with metastatic lung cancer.  The patient needs a Port-A-Cath for durable venous access, chemotherapy, lab draws, and CT scans. We are asked to place this. Risks and benefits were discussed and informed consent was obtained.  Description of procedure: The patient was brought to the vascular and interventional radiology suite.  Moderate conscious sedation was administered throughout the procedure with my supervision of the RN administering medicines and monitoring the patient's vital signs, pulse oximetry, telemetry and mental status throughout from the start of the procedure until the patient was taken to the recovery room. The right neck chest and shoulder were sterilely prepped and draped, and a sterile surgical field was created. Ultrasound was used to help visualize a patent right internal jugular vein. This was then accessed under direct ultrasound guidance without difficulty with the Seldinger needle and a permanent image was recorded. A J-wire was placed. After skin nick and dilatation, the peel-away sheath was then placed over the wire. I then anesthetized an area under the clavicle approximately 1-2 fingerbreadths. A transverse incision was created and an inferior pocket was created with electrocautery and blunt  dissection. The port was then brought onto the field, placed into the pocket and secured to the chest wall with 2 Prolene sutures. The catheter was connected to the port and tunneled from the subclavicular incision to the access site. Fluoroscopic guidance was then used to cut the catheter to an appropriate length. The catheter was then placed through the peel-away sheath and the peel-away sheath was removed. The catheter tip was parked in excellent location under fluorocoscopic guidance in the cavoatrial junction. The pocket was then irrigated with antibiotic impregnated saline and the wound was closed with a running 3-0 Vicryl and a 4-0 Monocryl. The access incision was closed with a single 4-0 Monocryl. The Huber needle was used to withdraw blood and flush the port with heparinized saline. Dermabond was then placed as a dressing. The patient tolerated the procedure well and was taken to the recovery room in stable condition.   Chris Mason 07/08/2015 9:20 AM

## 2015-07-08 NOTE — H&P (Signed)
  [facility] History & Physical Update  The patient was interviewed and re-examined.  The patient's previous History and Physical has been reviewed and is unchanged.  There is no change in the plan of care. We plan to proceed with the scheduled procedure.  DEW,JASON, MD  07/08/2015, 8:30 AM

## 2015-07-12 ENCOUNTER — Ambulatory Visit: Admission: RE | Admit: 2015-07-12 | Payer: Medicaid Other | Source: Ambulatory Visit

## 2015-07-12 ENCOUNTER — Ambulatory Visit: Payer: Medicaid Other

## 2015-07-13 ENCOUNTER — Inpatient Hospital Stay: Payer: Medicaid Other | Attending: Hematology and Oncology

## 2015-07-13 ENCOUNTER — Inpatient Hospital Stay (HOSPITAL_BASED_OUTPATIENT_CLINIC_OR_DEPARTMENT_OTHER): Payer: Medicaid Other | Admitting: Hematology and Oncology

## 2015-07-13 ENCOUNTER — Ambulatory Visit: Admission: RE | Admit: 2015-07-13 | Payer: Medicaid Other | Source: Ambulatory Visit

## 2015-07-13 ENCOUNTER — Ambulatory Visit: Payer: Medicaid Other

## 2015-07-13 ENCOUNTER — Other Ambulatory Visit: Payer: Self-pay | Admitting: Hematology and Oncology

## 2015-07-13 VITALS — BP 136/85 | HR 71 | Temp 96.6°F | Resp 18 | Wt 214.5 lb

## 2015-07-13 DIAGNOSIS — E039 Hypothyroidism, unspecified: Secondary | ICD-10-CM | POA: Diagnosis not present

## 2015-07-13 DIAGNOSIS — C787 Secondary malignant neoplasm of liver and intrahepatic bile duct: Secondary | ICD-10-CM

## 2015-07-13 DIAGNOSIS — I10 Essential (primary) hypertension: Secondary | ICD-10-CM | POA: Insufficient documentation

## 2015-07-13 DIAGNOSIS — R739 Hyperglycemia, unspecified: Secondary | ICD-10-CM | POA: Insufficient documentation

## 2015-07-13 DIAGNOSIS — C7951 Secondary malignant neoplasm of bone: Secondary | ICD-10-CM | POA: Insufficient documentation

## 2015-07-13 DIAGNOSIS — K047 Periapical abscess without sinus: Secondary | ICD-10-CM

## 2015-07-13 DIAGNOSIS — G4700 Insomnia, unspecified: Secondary | ICD-10-CM | POA: Insufficient documentation

## 2015-07-13 DIAGNOSIS — C3491 Malignant neoplasm of unspecified part of right bronchus or lung: Secondary | ICD-10-CM | POA: Diagnosis not present

## 2015-07-13 DIAGNOSIS — Z5111 Encounter for antineoplastic chemotherapy: Secondary | ICD-10-CM | POA: Insufficient documentation

## 2015-07-13 DIAGNOSIS — F17210 Nicotine dependence, cigarettes, uncomplicated: Secondary | ICD-10-CM | POA: Insufficient documentation

## 2015-07-13 DIAGNOSIS — C3412 Malignant neoplasm of upper lobe, left bronchus or lung: Secondary | ICD-10-CM | POA: Diagnosis not present

## 2015-07-13 DIAGNOSIS — C414 Malignant neoplasm of pelvic bones, sacrum and coccyx: Secondary | ICD-10-CM

## 2015-07-13 DIAGNOSIS — Z7689 Persons encountering health services in other specified circumstances: Secondary | ICD-10-CM | POA: Insufficient documentation

## 2015-07-13 LAB — COMPREHENSIVE METABOLIC PANEL
ALT: 26 U/L (ref 17–63)
AST: 20 U/L (ref 15–41)
Albumin: 3.7 g/dL (ref 3.5–5.0)
Alkaline Phosphatase: 101 U/L (ref 38–126)
Anion gap: 4 — ABNORMAL LOW (ref 5–15)
BUN: 16 mg/dL (ref 6–20)
CO2: 25 mmol/L (ref 22–32)
Calcium: 8.8 mg/dL — ABNORMAL LOW (ref 8.9–10.3)
Chloride: 105 mmol/L (ref 101–111)
Creatinine, Ser: 0.86 mg/dL (ref 0.61–1.24)
GFR calc Af Amer: >60 mL/min (ref >60)
GFR calc non Af Amer: >60 mL/min (ref >60)
Glucose, Bld: 240 mg/dL — ABNORMAL HIGH (ref 65–99)
Potassium: 4.2 mmol/L (ref 3.5–5.1)
Sodium: 134 mmol/L — ABNORMAL LOW (ref 135–145)
Total Bilirubin: 0.5 mg/dL (ref 0.3–1.2)
Total Protein: 7.4 g/dL (ref 6.5–8.1)

## 2015-07-13 LAB — CBC WITH DIFFERENTIAL/PLATELET
Basophils Absolute: 0.1 10*3/uL (ref 0–0.1)
Basophils Relative: 1 %
Eosinophils Absolute: 0.1 10*3/uL (ref 0–0.7)
Eosinophils Relative: 1 %
HCT: 42 % (ref 40.0–52.0)
Hemoglobin: 14.6 g/dL (ref 13.0–18.0)
Lymphocytes Relative: 25 %
Lymphs Abs: 3.2 10*3/uL (ref 1.0–3.6)
MCH: 30 pg (ref 26.0–34.0)
MCHC: 34.8 g/dL (ref 32.0–36.0)
MCV: 86.3 fL (ref 80.0–100.0)
Monocytes Absolute: 1.1 10*3/uL — ABNORMAL HIGH (ref 0.2–1.0)
Monocytes Relative: 9 %
Neutro Abs: 8.3 10*3/uL — ABNORMAL HIGH (ref 1.4–6.5)
Neutrophils Relative %: 64 %
Platelets: 291 10*3/uL (ref 150–440)
RBC: 4.87 MIL/uL (ref 4.40–5.90)
RDW: 13 % (ref 11.5–14.5)
WBC: 12.8 10*3/uL — ABNORMAL HIGH (ref 3.8–10.6)

## 2015-07-13 LAB — LACTATE DEHYDROGENASE: LDH: 127 U/L (ref 98–192)

## 2015-07-13 LAB — MAGNESIUM: Magnesium: 2.1 mg/dL (ref 1.7–2.4)

## 2015-07-13 NOTE — Progress Notes (Signed)
[facility]  Clinic day:  07/13/2015   Chief Complaint: Chris Mason is a 55 y.o. male with extensive stage small cell lung cancer who is seen for assessment prior to cycle #2 carboplatin and etoposide.  HPI:   The patient was last seen by me in the medical oncology clinic on 07/02/2015.  At that time, he was seen for nadir assessment on day 10 of cycle #1 carboplatin and etoposide.  Symptomatically, he felt less hip pain.  Counts were good (ANC is 8500).     During the interim, he has felt good.  His left hip pain has improved.  He comments that pain occurs on certain movements.  He has required minimal pain medications (3 pills yesterday, 1 pill the the day before and none the day before).  He has been more active.  He was doing yard work yesterday.  He also notes some intermittent tooth pain. He recently had some Hubba Bubba gum and pulled out a filling.   Past Medical History  Diagnosis Date  . Hypertension   . Hyperthyroidism   . Cancer (HCC)     right lung mass, left pubic bone lesion, hepatic mass    Past Surgical History  Procedure Laterality Date  . Elbow arthroplasty    . Peripheral vascular catheterization N/A 07/08/2015    Procedure: Shelda Pal Cath Insertion;  Surgeon: Annice Needy, MD;  Location: [facility] INVASIVE CV LAB;  Service: Cardiovascular;  Laterality: N/A;    No family history on file.  Social History:  reports that he has been smoking Cigarettes.  He has a 17.5 pack-year smoking history. He does not have any smokeless tobacco history on file. He reports that he does not drink alcohol or use illicit drugs.  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  The patient is accompanied by Chris Mason, lung cancer clinical coordinator, today.  Allergies: No Known Allergies  Current Medications: Current Outpatient Prescriptions  Medication Sig Dispense Refill  . LORazepam  (ATIVAN) 0.5 MG tablet Take 1 tablet (0.5 mg total) by mouth every 6 (six) hours as needed for anxiety. 30 tablet 0  . losartan-hydrochlorothiazide (HYZAAR) 50-12.5 MG per tablet Take 1 tablet by mouth daily.    . magic mouthwash w/lidocaine SOLN Take 10 mLs by mouth 4 (four) times daily as needed for mouth pain. 480 mL 3  . oxyCODONE (OXY IR/ROXICODONE) 5 MG immediate release tablet Take 1 tablet (5 mg total) by mouth every 4 (four) hours as needed for severe pain. 120 tablet 0   No current facility-administered medications for this visit.   Review of Systems:  GENERAL:  Feels good.  No fevers or sweats.  Weight up 2 pounds. PERFORMANCE STATUS (ECOG):  1 HEENT:  Tooth issues.  No visual changes, runny nose, sore throat, mouth sores or tenderness. Lungs:  No shortness of breath or cough.  No hemoptysis. Cardiac:  No chest pain, palpitations, orthopnea, or PND. GI:  No nausea, vomiting, diarrhea, constipation, melena or hematochezia.  No prior colonoscopy. GU:  No urgency, frequency, dysuria, or hematuria. Musculoskeletal:  Left hip pain, improved.  Left rib pain and back pain, improved.  No muscle tenderness. Extremities:  No pain or swelling. Skin:  No rashes, skin changes, or ulcers. Neuro:  No headaches, numbness or weakness, balance or coordination issues. Endocrine:  No diabetes, thyroid issues, hot flashes or night sweats. Psych:  Anxiety.  No mood changes or depression. Pain:  No focal pain. Review of systems:  All other systems reviewed and found to be negative.  Physical Exam: Blood pressure 136/85, pulse 71, temperature 96.6 F (35.9 C), temperature source Tympanic, resp. rate 18, weight 214 lb 8.1 oz (97.3 kg). GENERAL:  Well developed, well nourished, moving about in the exam room in no acute distress.  MENTAL STATUS:  Alert and oriented to person, place and time. HEAD:  Short brown hair thinning with graying goatee.  Normocephalic, atraumatic, face symmetric, no Cushingoid  features. EYES:  Pupils equal round and reactive to light and accomodation.  No conjunctivitis or scleral icterus. ENT:  Oropharynx clear without lesion.  Dental caries.  Two lower teeth decaying.  Tongue normal. Mucous membranes moist.  RESPIRATORY:  Clear to auscultation without rales, wheezes or rhonchi. CARDIOVASCULAR:  Regular rate and rhythm without murmur, rub or gallop. ABDOMEN:  Soft, non-tender, with active bowel sounds, and no hepatosplenomegaly.  No masses. SKIN:  No rashes, ulcers or lesions. EXTREMITIES: No edema, no skin discoloration or tenderness.  No palpable cords. LYMPH NODES: No palpable cervical, supraclavicular, axillary or inguinal adenopathy  NEUROLOGICAL: Unremarkable. PSYCH:  Appropriate.   Appointment on 07/13/2015  Component Date Value Ref Range Status  . WBC 07/13/2015 12.8* 3.8 - 10.6 K/uL Final  . RBC 07/13/2015 4.87  4.40 - 5.90 MIL/uL Final  . Hemoglobin 07/13/2015 14.6  13.0 - 18.0 g/dL Final  . HCT 07/13/2015 42.0  40.0 - 52.0 % Final  . MCV 07/13/2015 86.3  80.0 - 100.0 fL Final  . MCH 07/13/2015 30.0  26.0 - 34.0 pg Final  . MCHC 07/13/2015 34.8  32.0 - 36.0 g/dL Final  . RDW 07/13/2015 13.0  11.5 - 14.5 % Final  . Platelets 07/13/2015 291  150 - 440 K/uL Final  . Neutrophils Relative % 07/13/2015 64   Final  . Neutro Abs 07/13/2015 8.3* 1.4 - 6.5 K/uL Final  . Lymphocytes Relative 07/13/2015 25   Final  . Lymphs Abs 07/13/2015 3.2  1.0 - 3.6 K/uL Final  . Monocytes Relative 07/13/2015 9   Final  . Monocytes Absolute 07/13/2015 1.1* 0.2 - 1.0 K/uL Final  . Eosinophils Relative 07/13/2015 1   Final  . Eosinophils Absolute 07/13/2015 0.1  0 - 0.7 K/uL Final  . Basophils Relative 07/13/2015 1   Final  . Basophils Absolute 07/13/2015 0.1  0 - 0.1 K/uL Final  . Sodium 07/13/2015 134* 135 - 145 mmol/L Final  . Potassium 07/13/2015 4.2  3.5 - 5.1 mmol/L Final  . Chloride 07/13/2015 105  101 - 111 mmol/L Final  . CO2 07/13/2015 25  22 - 32 mmol/L  Final  . Glucose, Bld 07/13/2015 240* 65 - 99 mg/dL Final  . BUN 07/13/2015 16  6 - 20 mg/dL Final  . Creatinine, Ser 07/13/2015 0.86  0.61 - 1.24 mg/dL Final  . Calcium 07/13/2015 8.8* 8.9 - 10.3 mg/dL Final  . Total Protein 07/13/2015 7.4  6.5 - 8.1 g/dL Final  . Albumin 07/13/2015 3.7  3.5 - 5.0 g/dL Final  . AST 07/13/2015 20  15 - 41 U/L Final  . ALT 07/13/2015 26  17 - 63 U/L Final  . Alkaline Phosphatase 07/13/2015 101  38 - 126 U/L Final  . Total Bilirubin 07/13/2015 0.5  0.3 - 1.2 mg/dL Final  . GFR calc non Af Amer 07/13/2015 >60  >60 mL/min Final  . GFR calc Af Amer 07/13/2015 >60  >60 mL/min Final  Comment: (NOTE) The eGFR has been calculated using the CKD EPI equation. This calculation has not been validated in all clinical situations. eGFR's persistently <60 mL/min signify possible Chronic Kidney Disease.   . Anion gap 07/13/2015 4* 5 - 15 Final  . Magnesium 07/13/2015 2.1  1.7 - 2.4 mg/dL Final  . LDH 07/13/2015 127  98 - 192 U/L Final    Assessment:  TESLA BOCHICCHIO is a 55 y.o. male with extensive stage small cell lung cancer.  He presented with a 2 month history of left hip pain.  CXR revealed a nodular density in the right upper lobe.  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    CT scan of the left hip on 06/04/2015 revealed a 3.3 x 2.5 cm destructive lesion in the left parasymphyseal pubic bone and superior pubic ramus.  Surrounding sift tissues demonstrated a low-attenuation soft tissue component of the lesion.  There was subtle lucency of the left greater trochanter and a small focus of cortical destruction along the anterior aspect of the greater trochanter.  There was also lucency in the periphery of the left femoral head without cortical disruption.  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 There was a dominant 2.0 x 1.8 cm right upper  lobe pulmonary nodule (SUV 8.9) with thoracic nodal, hepatic, and multifocal osseous metastasis.  Examples within the left pubic bone at a SUV of 11.3, left transverse process at T7 (SUV of 8.9), C4 vertebral body (SUV of 5.7), lateral eighth left rib (SUV of 6.3.).  There was a 3.8 cm right hepatic lobe hypo-attenuating mass (SUV 8.4).  CEA was 912.6 on 06/23/2015.  Head MRI on 06/16/2015 revealed no evidence for metastatic disease.   He receives Niger monthly (began 06/23/2015)  He is currently day 21 s/p cycle #1 carboplatin and etoposide (06/22/2105).     Symptomatically, he feels good.  Hip pain is less.  He has some dental issues.  He has mild hypocalcemia.  Blood sugar is elevated.  Plan: 1.  Labs today:  CBC with diff, CMP, Mg, CEA, LDH. 2.  RTC tomorrow to begin cycle #2 carboplatin and etoposide with Neulasta support. 3.  Discuss follow-up with [facility] regarding hyperglycemia. 4.  Discuss calcium and vitamin D supplimentation. 5.  Follow-up with dentistry prior to further Xgeva. 6.  Schedule chest, abdomen, pelvic CT in 2 1/2 weeks. 7.  RTC on 07/21/2015 for labs (BMP) and Xgeva if cleared by dentistry. 8.  RTC on 08/03/2015 for MD assessment, labs (CBC with diff, CMP, Mg, CEA), review of CT scan, and assessment prior to cycle #3 carboplatin and etoposide beginning 08/04/2015.  Addendum:  I spoke with Dr. Eugenie Birks, dentist, after his appointment.  He has 2 dental abscesses which need to be extracted.  We discussed his recent Xgeva and issues regarding potential osteonecrosis.  We discussed issues with infection with his chemotherapy.  She feels that the teeth need to be removed now.  He is being referred to an oral surgeon.  We discussed SBE prophylaxis with his indwelling central line.  Chemotherapy will be delayed for 1 week.  All scheduled appointments will be moved one week.  Delton See will be postponed until his mouth has healed and has been cleared by dentistry (at least 6  weeks).   Lequita Asal, MD  07/13/2015, 9:34 AM

## 2015-07-14 ENCOUNTER — Ambulatory Visit: Payer: Medicaid Other

## 2015-07-14 ENCOUNTER — Telehealth: Payer: Self-pay

## 2015-07-14 ENCOUNTER — Inpatient Hospital Stay: Payer: Medicaid Other

## 2015-07-14 ENCOUNTER — Encounter: Payer: Self-pay | Admitting: Hematology and Oncology

## 2015-07-14 LAB — CEA: CEA: 429.8 ng/mL — ABNORMAL HIGH (ref 0.0–4.7)

## 2015-07-14 NOTE — Telephone Encounter (Signed)
Called pt per MD to report the CEA is lower at 429.8.  Pt verbalized an understanding and thanked [place] for calling.  Reported he is so happy that he just prayed about this and I called.  No other concerns noted.

## 2015-07-15 ENCOUNTER — Ambulatory Visit: Payer: Medicaid Other

## 2015-07-15 ENCOUNTER — Inpatient Hospital Stay: Payer: Medicaid Other

## 2015-07-16 ENCOUNTER — Ambulatory Visit: Payer: Medicaid Other

## 2015-07-16 ENCOUNTER — Telehealth: Payer: Self-pay | Admitting: *Deleted

## 2015-07-16 ENCOUNTER — Inpatient Hospital Stay: Payer: Medicaid Other

## 2015-07-16 NOTE — Telephone Encounter (Signed)
Patient reported that he saw PCP regarding hyperglycemia and it was checked within normal range. He has been modifying his diet and will continue to do so.

## 2015-07-19 ENCOUNTER — Other Ambulatory Visit: Payer: Self-pay | Admitting: *Deleted

## 2015-07-19 ENCOUNTER — Ambulatory Visit: Payer: Medicaid Other

## 2015-07-19 DIAGNOSIS — C3491 Malignant neoplasm of unspecified part of right bronchus or lung: Secondary | ICD-10-CM

## 2015-07-20 ENCOUNTER — Inpatient Hospital Stay: Payer: Medicaid Other

## 2015-07-20 ENCOUNTER — Ambulatory Visit: Payer: Self-pay

## 2015-07-20 ENCOUNTER — Other Ambulatory Visit: Payer: Self-pay

## 2015-07-20 ENCOUNTER — Ambulatory Visit: Payer: Medicaid Other

## 2015-07-20 ENCOUNTER — Inpatient Hospital Stay (HOSPITAL_BASED_OUTPATIENT_CLINIC_OR_DEPARTMENT_OTHER): Payer: Medicaid Other | Admitting: Hematology and Oncology

## 2015-07-20 ENCOUNTER — Other Ambulatory Visit: Payer: Self-pay | Admitting: Hematology and Oncology

## 2015-07-20 VITALS — BP 153/87 | HR 67 | Temp 95.2°F | Resp 18 | Ht 70.5 in | Wt 213.7 lb

## 2015-07-20 DIAGNOSIS — F17210 Nicotine dependence, cigarettes, uncomplicated: Secondary | ICD-10-CM

## 2015-07-20 DIAGNOSIS — C787 Secondary malignant neoplasm of liver and intrahepatic bile duct: Secondary | ICD-10-CM

## 2015-07-20 DIAGNOSIS — G4700 Insomnia, unspecified: Secondary | ICD-10-CM

## 2015-07-20 DIAGNOSIS — I10 Essential (primary) hypertension: Secondary | ICD-10-CM

## 2015-07-20 DIAGNOSIS — R739 Hyperglycemia, unspecified: Secondary | ICD-10-CM

## 2015-07-20 DIAGNOSIS — E039 Hypothyroidism, unspecified: Secondary | ICD-10-CM

## 2015-07-20 DIAGNOSIS — C3412 Malignant neoplasm of upper lobe, left bronchus or lung: Secondary | ICD-10-CM | POA: Diagnosis not present

## 2015-07-20 DIAGNOSIS — C7951 Secondary malignant neoplasm of bone: Secondary | ICD-10-CM

## 2015-07-20 DIAGNOSIS — C3491 Malignant neoplasm of unspecified part of right bronchus or lung: Secondary | ICD-10-CM

## 2015-07-20 DIAGNOSIS — C3490 Malignant neoplasm of unspecified part of unspecified bronchus or lung: Secondary | ICD-10-CM

## 2015-07-20 DIAGNOSIS — Z5111 Encounter for antineoplastic chemotherapy: Secondary | ICD-10-CM | POA: Diagnosis not present

## 2015-07-20 DIAGNOSIS — Z7689 Persons encountering health services in other specified circumstances: Secondary | ICD-10-CM | POA: Diagnosis not present

## 2015-07-20 DIAGNOSIS — C414 Malignant neoplasm of pelvic bones, sacrum and coccyx: Secondary | ICD-10-CM

## 2015-07-20 DIAGNOSIS — G893 Neoplasm related pain (acute) (chronic): Secondary | ICD-10-CM

## 2015-07-20 LAB — CBC WITH DIFFERENTIAL/PLATELET
Basophils Absolute: 0.1 10*3/uL (ref 0–0.1)
Basophils Relative: 1 %
Eosinophils Absolute: 0.1 10*3/uL (ref 0–0.7)
Eosinophils Relative: 1 %
HCT: 43.1 % (ref 40.0–52.0)
Hemoglobin: 15.1 g/dL (ref 13.0–18.0)
Lymphocytes Relative: 27 %
Lymphs Abs: 2.8 10*3/uL (ref 1.0–3.6)
MCH: 30.1 pg (ref 26.0–34.0)
MCHC: 35 g/dL (ref 32.0–36.0)
MCV: 85.9 fL (ref 80.0–100.0)
Monocytes Absolute: 1.1 10*3/uL — ABNORMAL HIGH (ref 0.2–1.0)
Monocytes Relative: 11 %
Neutro Abs: 6.2 10*3/uL (ref 1.4–6.5)
Neutrophils Relative %: 60 %
Platelets: 455 10*3/uL — ABNORMAL HIGH (ref 150–440)
RBC: 5.02 MIL/uL (ref 4.40–5.90)
RDW: 13.4 % (ref 11.5–14.5)
WBC: 10.3 10*3/uL (ref 3.8–10.6)

## 2015-07-20 LAB — COMPREHENSIVE METABOLIC PANEL
ALT: 25 U/L (ref 17–63)
AST: 25 U/L (ref 15–41)
Albumin: 3.9 g/dL (ref 3.5–5.0)
Alkaline Phosphatase: 93 U/L (ref 38–126)
Anion gap: 4 — ABNORMAL LOW (ref 5–15)
BUN: 13 mg/dL (ref 6–20)
CO2: 24 mmol/L (ref 22–32)
Calcium: 8.8 mg/dL — ABNORMAL LOW (ref 8.9–10.3)
Chloride: 107 mmol/L (ref 101–111)
Creatinine, Ser: 0.73 mg/dL (ref 0.61–1.24)
GFR calc Af Amer: >60 mL/min (ref >60)
GFR calc non Af Amer: >60 mL/min (ref >60)
Glucose, Bld: 142 mg/dL — ABNORMAL HIGH (ref 65–99)
Potassium: 4.2 mmol/L (ref 3.5–5.1)
Sodium: 135 mmol/L (ref 135–145)
Total Bilirubin: 0.6 mg/dL (ref 0.3–1.2)
Total Protein: 8.1 g/dL (ref 6.5–8.1)

## 2015-07-20 MED ORDER — LORAZEPAM 0.5 MG PO TABS: 0.5000 mg | ORAL_TABLET | Freq: Four times a day (QID) | ORAL | Status: DC | PRN

## 2015-07-20 MED ORDER — LIDOCAINE-PRILOCAINE 2.5-2.5 % EX CREA: 1.0000 "application " | TOPICAL_CREAM | CUTANEOUS | Status: DC | PRN

## 2015-07-20 MED ORDER — LIDOCAINE-PRILOCAINE 2.5-2.5 % EX CREA: TOPICAL_CREAM | CUTANEOUS | Status: DC

## 2015-07-20 NOTE — Progress Notes (Signed)
[facility] day:  07/20/2015   Chief Complaint: TAEVIN MCFERRAN is a 55 y.o. male with extensive stage small cell lung cancer who is seen for assessment prior to cycle #2 carboplatin and etoposide.  HPI:   The patient was last seen by me in the medical oncology clinic on 07/13/2015.  At that time, chemotherapy was cancelled secondary to 2 dental abscesses.  He was had his teeth extracted uneventfully.  He is on amoxicillin.  He has had no fevers.  He feels that his pain is slightly worse in his back, ribs, and left side since delaying chemotherapy a week.  He has had some trouble sleeping.  He notes "bad dreams".  He is ready to begin his next cycle of chemotherapy.   Past Medical History  Diagnosis Date  . Hypertension   . Hyperthyroidism   . Cancer ([place])     right lung mass, left pubic bone lesion, hepatic mass    Past Surgical History  Procedure Laterality Date  . Elbow arthroplasty    . Peripheral vascular catheterization N/A 07/08/2015    Procedure: Glori Luis Cath Insertion;  Surgeon: Algernon Huxley, MD;  Location: [facility];  Service: Cardiovascular;  Laterality: N/A;    No family history on file.  Social History:  reports that he has been smoking Cigarettes.  He has a 17.5 pack-year smoking history. He does not have any smokeless tobacco history on file. He reports that he does not drink alcohol or use illicit drugs.  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  The patient is alone today.  Allergies: No Known Allergies  Current Medications: Current Outpatient Prescriptions  Medication Sig Dispense Refill  . LORazepam (ATIVAN) 0.5 MG tablet Take 1 tablet (0.5 mg total) by mouth every 6 (six) hours as needed for anxiety. 30 tablet 0  . losartan-hydrochlorothiazide (HYZAAR) 50-12.5 MG per tablet Take 1 tablet by mouth daily.    . magic mouthwash w/lidocaine SOLN  Take 10 mLs by mouth 4 (four) times daily as needed for mouth pain. 480 mL 3  . oxyCODONE (OXY IR/ROXICODONE) 5 MG immediate release tablet Take 1 tablet (5 mg total) by mouth every 4 (four) hours as needed for severe pain. 120 tablet 0   No current facility-administered medications for this visit.   Review of Systems:  GENERAL:  Feels "pretty good".  No fevers or sweats.  Weight down 1 pound. PERFORMANCE STATUS (ECOG):  1 HEENT:  Dental abscesses s/p extraction.  No visual changes, runny nose, sore throat, mouth sores or tenderness. Lungs:  No shortness of breath or cough.  No hemoptysis. Cardiac:  No chest pain, palpitations, orthopnea, or PND. GI:  No nausea, vomiting, diarrhea, constipation, melena or hematochezia.  No prior colonoscopy. GU:  No urgency, frequency, dysuria, or hematuria. Musculoskeletal:  Left hip pain and rib pain and back pain, slightly worse.  No muscle tenderness. Extremities:  No pain or swelling. Skin:  No rashes, skin changes, or ulcers. Neuro:  No headaches, numbness or weakness, balance or coordination issues. Endocrine:  No diabetes, thyroid issues, hot flashes or night sweats. Psych:  Anxiety.  No mood changes or depression. Pain:  No focal pain. Review of systems:  All other systems reviewed and found to be negative.  Physical Exam: Blood pressure 153/87, pulse 67, temperature 95.2 F (35.1 C), temperature source Tympanic, resp. rate 18, height  5' 10.5" (1.791 m), weight 213 lb 11.8 oz (96.95 kg). GENERAL:  Well developed, well nourished, moving about in the exam room in no acute distress.  MENTAL STATUS:  Alert and oriented to person, place and time. HEAD:  Short brown hair thinning with graying goatee.  Normocephalic, atraumatic, face symmetric, no Cushingoid features. EYES:  Pupils equal round and reactive to light and accomodation.  No conjunctivitis or scleral icterus. ENT:  Oropharynx clear without lesion. S/p dental extractions, well healing.   Tongue normal. Mucous membranes moist.  RESPIRATORY:  Clear to auscultation without rales, wheezes or rhonchi. CARDIOVASCULAR:  Regular rate and rhythm without murmur, rub or gallop. ABDOMEN:  Soft, non-tender, with active bowel sounds, and no hepatosplenomegaly.  No masses. SKIN:  No rashes, ulcers or lesions. EXTREMITIES: No edema, no skin discoloration or tenderness.  No palpable cords. LYMPH NODES: No palpable cervical, supraclavicular, axillary or inguinal adenopathy  NEUROLOGICAL: Unremarkable. PSYCH:  Appropriate.   Appointment on 07/20/2015  Component Date Value Ref Range Status  . Sodium 07/20/2015 135  135 - 145 mmol/L Final  . Potassium 07/20/2015 4.2  3.5 - 5.1 mmol/L Final  . Chloride 07/20/2015 107  101 - 111 mmol/L Final  . CO2 07/20/2015 24  22 - 32 mmol/L Final  . Glucose, Bld 07/20/2015 142* 65 - 99 mg/dL Final  . BUN 07/20/2015 13  6 - 20 mg/dL Final  . Creatinine, Ser 07/20/2015 0.73  0.61 - 1.24 mg/dL Final  . Calcium 07/20/2015 8.8* 8.9 - 10.3 mg/dL Final  . Total Protein 07/20/2015 8.1  6.5 - 8.1 g/dL Final  . Albumin 07/20/2015 3.9  3.5 - 5.0 g/dL Final  . AST 07/20/2015 25  15 - 41 U/L Final  . ALT 07/20/2015 25  17 - 63 U/L Final  . Alkaline Phosphatase 07/20/2015 93  38 - 126 U/L Final  . Total Bilirubin 07/20/2015 0.6  0.3 - 1.2 mg/dL Final  . GFR calc non Af Amer 07/20/2015 >60  >60 mL/min Final  . GFR calc Af Amer 07/20/2015 >60  >60 mL/min Final   Comment: (NOTE) The eGFR has been calculated using the CKD EPI equation. This calculation has not been validated in all clinical situations. eGFR's persistently <60 mL/min signify possible Chronic Kidney Disease.   . Anion gap 07/20/2015 4* 5 - 15 Final  . WBC 07/20/2015 10.3  3.8 - 10.6 K/uL Final  . RBC 07/20/2015 5.02  4.40 - 5.90 MIL/uL Final  . Hemoglobin 07/20/2015 15.1  13.0 - 18.0 g/dL Final  . HCT 07/20/2015 43.1  40.0 - 52.0 % Final  . MCV 07/20/2015 85.9  80.0 - 100.0 fL Final  . MCH  07/20/2015 30.1  26.0 - 34.0 pg Final  . MCHC 07/20/2015 35.0  32.0 - 36.0 g/dL Final  . RDW 07/20/2015 13.4  11.5 - 14.5 % Final  . Platelets 07/20/2015 455* 150 - 440 K/uL Final  . Neutrophils Relative % 07/20/2015 60   Final  . Neutro Abs 07/20/2015 6.2  1.4 - 6.5 K/uL Final  . Lymphocytes Relative 07/20/2015 27   Final  . Lymphs Abs 07/20/2015 2.8  1.0 - 3.6 K/uL Final  . Monocytes Relative 07/20/2015 11   Final  . Monocytes Absolute 07/20/2015 1.1* 0.2 - 1.0 K/uL Final  . Eosinophils Relative 07/20/2015 1   Final  . Eosinophils Absolute 07/20/2015 0.1  0 - 0.7 K/uL Final  . Basophils Relative 07/20/2015 1   Final  . Basophils Absolute 07/20/2015 0.1  0 -  0.1 K/uL Final    Assessment:  DEQUANTE TREMAINE is a 55 y.o. male with extensive stage small cell lung cancer.  He presented with a 2 month history of left hip pain.  CXR revealed a nodular density in the right upper lobe.  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    CT scan of the left hip on 06/04/2015 revealed a 3.3 x 2.5 cm destructive lesion in the left parasymphyseal pubic bone and superior pubic ramus.  Surrounding sift tissues demonstrated a low-attenuation soft tissue component of the lesion.  There was subtle lucency of the left greater trochanter and a small focus of cortical destruction along the anterior aspect of the greater trochanter.  There was also lucency in the periphery of the left femoral head without cortical disruption.  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 There was a dominant 2.0 x 1.8 cm right upper lobe pulmonary nodule (SUV 8.9) with thoracic nodal, hepatic, and multifocal osseous metastasis.  Examples within the left pubic bone at a SUV of 11.3, left transverse process at T7 (SUV of 8.9), C4 vertebral body (SUV of 5.7), lateral eighth left rib (SUV of 6.3.).  There was a 3.8 cm right  hepatic lobe hypo-attenuating mass (SUV 8.4).    CEA was 912.6 on 06/23/2015 and 429.8 on 07/13/2015.  LDH was 205 on 06/23/2015 and 127 on 07/13/2015.  Head MRI on 06/16/2015 revealed no evidence for metastatic disease.   He receives Niger monthly (began 06/23/2015).  He underwent 2 dental extractions on 07/13/2015.  He is on amoxicillin for dental abscesses.  He is s/p cycle #1 carboplatin and etoposide (06/22/2105) with Neulasta support.    Symptomatically, he feels that his pain is slightly worse after his chemotherapy delay.  Exam is stable.  Plan: 1.  Labs today:  CBC with diff, CMP, Mg, CEA, LDH. 2.  RTC tomorrow to begin cycle #2 carboplatin and etoposide with Neulasta support. 3.  Discuss follow-up with [facility] (Dr. Benny Lennert). 4.  Continue calcium and vitamin D supplimentation. 5.  Follow-up with dentistry prior to further Xgeva (at least 6 weeks). 6.  Rechedule chest, abdomen, pelvic CT for 08/06/2015. 7.  RTC on 08/10/2015 for MD assessment, labs (CBC with diff, CMP, Mg, CEA), review of CT scans, and assessment prior to cycle #3 carboplatin and etoposide beginning 08/11/2015.   Lequita Asal, MD  07/20/2015, 9:44 AM

## 2015-07-20 NOTE — Progress Notes (Signed)
Pt reports he has pain in his back and rib cage area on left side and pain in the left leg.  Feels like pain is worse since he missed treatment last week

## 2015-07-21 ENCOUNTER — Other Ambulatory Visit: Payer: Self-pay

## 2015-07-21 ENCOUNTER — Inpatient Hospital Stay: Payer: Medicaid Other

## 2015-07-21 ENCOUNTER — Ambulatory Visit: Payer: Medicaid Other

## 2015-07-21 ENCOUNTER — Ambulatory Visit: Payer: Self-pay

## 2015-07-21 VITALS — BP 129/81 | HR 71 | Resp 18

## 2015-07-21 DIAGNOSIS — C3491 Malignant neoplasm of unspecified part of right bronchus or lung: Secondary | ICD-10-CM

## 2015-07-21 DIAGNOSIS — Z5111 Encounter for antineoplastic chemotherapy: Secondary | ICD-10-CM | POA: Diagnosis not present

## 2015-07-21 MED ORDER — HEPARIN SOD (PORK) LOCK FLUSH 100 UNIT/ML IV SOLN
500.0000 [IU] | Freq: Once | INTRAVENOUS | Status: AC | PRN
  Administered 2015-07-21: 500 [IU]
  Filled 2015-07-21: qty 5

## 2015-07-21 MED ORDER — HEPARIN SOD (PORK) LOCK FLUSH 100 UNIT/ML IV SOLN
INTRAVENOUS | Status: AC
  Filled 2015-07-21: qty 5

## 2015-07-21 MED ORDER — SODIUM CHLORIDE 0.9% FLUSH
10.0000 mL | INTRAVENOUS | Status: DC | PRN
  Administered 2015-07-21: 10 mL via INTRAVENOUS
  Filled 2015-07-21: qty 10

## 2015-07-21 MED ORDER — SODIUM CHLORIDE 0.9 % IV SOLN
100.0000 mg/m2 | Freq: Once | INTRAVENOUS | Status: AC
  Administered 2015-07-21: 220 mg via INTRAVENOUS
  Filled 2015-07-21: qty 11

## 2015-07-21 MED ORDER — SODIUM CHLORIDE 0.9 % IV SOLN
10.0000 mg | Freq: Once | INTRAVENOUS | Status: AC
  Administered 2015-07-21: 10 mg via INTRAVENOUS
  Filled 2015-07-21: qty 1

## 2015-07-21 MED ORDER — PALONOSETRON HCL INJECTION 0.25 MG/5ML
0.2500 mg | Freq: Once | INTRAVENOUS | Status: AC
  Administered 2015-07-21: 0.25 mg via INTRAVENOUS
  Filled 2015-07-21: qty 5

## 2015-07-21 MED ORDER — SODIUM CHLORIDE 0.9 % IV SOLN
Freq: Once | INTRAVENOUS | Status: AC
  Administered 2015-07-21: 14:00:00 via INTRAVENOUS
  Filled 2015-07-21: qty 1000

## 2015-07-21 MED ORDER — CARBOPLATIN CHEMO INJECTION 600 MG/60ML
750.0000 mg | Freq: Once | INTRAVENOUS | Status: AC
  Administered 2015-07-21: 750 mg via INTRAVENOUS
  Filled 2015-07-21: qty 75

## 2015-07-22 ENCOUNTER — Ambulatory Visit: Payer: Medicaid Other

## 2015-07-22 ENCOUNTER — Ambulatory Visit: Payer: Self-pay

## 2015-07-22 ENCOUNTER — Inpatient Hospital Stay: Payer: Medicaid Other

## 2015-07-22 ENCOUNTER — Other Ambulatory Visit: Payer: Self-pay | Admitting: Hematology and Oncology

## 2015-07-22 VITALS — BP 113/73 | HR 76 | Temp 97.8°F | Resp 18

## 2015-07-22 DIAGNOSIS — Z5111 Encounter for antineoplastic chemotherapy: Secondary | ICD-10-CM | POA: Diagnosis not present

## 2015-07-22 DIAGNOSIS — C19 Malignant neoplasm of rectosigmoid junction: Secondary | ICD-10-CM

## 2015-07-22 DIAGNOSIS — C3491 Malignant neoplasm of unspecified part of right bronchus or lung: Secondary | ICD-10-CM

## 2015-07-22 MED ORDER — ETOPOSIDE CHEMO INJECTION 1 GM/50ML
100.0000 mg/m2 | Freq: Once | INTRAVENOUS | Status: AC
  Administered 2015-07-22: 220 mg via INTRAVENOUS
  Filled 2015-07-22: qty 11

## 2015-07-22 MED ORDER — HEPARIN SOD (PORK) LOCK FLUSH 100 UNIT/ML IV SOLN
500.0000 [IU] | Freq: Once | INTRAVENOUS | Status: AC
  Administered 2015-07-22: 500 [IU] via INTRAVENOUS

## 2015-07-22 MED ORDER — DEXAMETHASONE SODIUM PHOSPHATE 100 MG/10ML IJ SOLN
10.0000 mg | Freq: Once | INTRAMUSCULAR | Status: AC
  Administered 2015-07-22: 10 mg via INTRAVENOUS
  Filled 2015-07-22: qty 1

## 2015-07-22 MED ORDER — HEPARIN SOD (PORK) LOCK FLUSH 100 UNIT/ML IV SOLN
INTRAVENOUS | Status: AC
  Filled 2015-07-22: qty 5

## 2015-07-22 MED ORDER — SODIUM CHLORIDE 0.9 % IV SOLN
Freq: Once | INTRAVENOUS | Status: AC
  Administered 2015-07-22: 14:00:00 via INTRAVENOUS
  Filled 2015-07-22: qty 1000

## 2015-07-23 ENCOUNTER — Inpatient Hospital Stay: Payer: Medicaid Other

## 2015-07-23 ENCOUNTER — Ambulatory Visit: Payer: Self-pay

## 2015-07-23 ENCOUNTER — Other Ambulatory Visit: Payer: Self-pay | Admitting: Hematology and Oncology

## 2015-07-23 ENCOUNTER — Ambulatory Visit: Payer: Medicaid Other

## 2015-07-23 VITALS — BP 140/92 | HR 77 | Temp 97.6°F

## 2015-07-23 DIAGNOSIS — Z5111 Encounter for antineoplastic chemotherapy: Secondary | ICD-10-CM | POA: Diagnosis not present

## 2015-07-23 DIAGNOSIS — C3491 Malignant neoplasm of unspecified part of right bronchus or lung: Secondary | ICD-10-CM

## 2015-07-23 MED ORDER — PEGFILGRASTIM 6 MG/0.6ML ~~LOC~~ PSKT
6.0000 mg | PREFILLED_SYRINGE | Freq: Once | SUBCUTANEOUS | Status: AC
  Administered 2015-07-23: 6 mg via SUBCUTANEOUS
  Filled 2015-07-23: qty 0.6

## 2015-07-23 MED ORDER — ETOPOSIDE CHEMO INJECTION 1 GM/50ML
100.0000 mg/m2 | Freq: Once | INTRAVENOUS | Status: AC
  Administered 2015-07-23: 220 mg via INTRAVENOUS
  Filled 2015-07-23: qty 11

## 2015-07-23 MED ORDER — SODIUM CHLORIDE 0.9% FLUSH
10.0000 mL | INTRAVENOUS | Status: DC | PRN
  Administered 2015-07-23: 10 mL
  Filled 2015-07-23: qty 10

## 2015-07-23 MED ORDER — SODIUM CHLORIDE 0.9 % IV SOLN
10.0000 mg | Freq: Once | INTRAVENOUS | Status: AC
  Administered 2015-07-23: 10 mg via INTRAVENOUS
  Filled 2015-07-23: qty 1

## 2015-07-23 MED ORDER — SODIUM CHLORIDE 0.9 % IV SOLN
Freq: Once | INTRAVENOUS | Status: AC
  Administered 2015-07-23: 14:00:00 via INTRAVENOUS
  Filled 2015-07-23: qty 1000

## 2015-07-23 MED ORDER — HEPARIN SOD (PORK) LOCK FLUSH 100 UNIT/ML IV SOLN
500.0000 [IU] | Freq: Once | INTRAVENOUS | Status: AC | PRN
  Administered 2015-07-23: 500 [IU]
  Filled 2015-07-23: qty 5

## 2015-07-29 ENCOUNTER — Telehealth: Payer: Self-pay | Admitting: *Deleted

## 2015-07-29 MED ORDER — OXYCODONE HCL 10 MG PO TABS: 10.0000 mg | ORAL_TABLET | ORAL | Status: DC | PRN

## 2015-07-29 MED ORDER — LORAZEPAM 1 MG PO TABS: ORAL_TABLET | ORAL | Status: DC

## 2015-07-29 NOTE — Addendum Note (Signed)
Addended by: Luella Cook on: 07/29/2015 03:12 PM   Modules accepted: Orders, Medications

## 2015-07-29 NOTE — Telephone Encounter (Signed)
Spoke to Chris Mason and she states to change his oxycodone to 10 mg instead of 5 mg and change his lorazepam to 1 mg from 0.5 mg.  He can come pick up rx anytime today. She states that the pain can come from neulasta  And then she is happy because tumor marker coming down so much and we are getting scan 5/5 and than will give [place] a picture of what the chemo is doing to his cancer.

## 2015-07-29 NOTE — Telephone Encounter (Signed)
Pt called and would like to speak to someone about his pain.  His leg and rib pain went away a couple of days after chemo and now it is back for the last 2 days and today it is some better.  He did have teeth removed and they gave him no pain med.  So he took the pain med that was prescribed from [facility].  He will need refill of his crazy pillshe took 3 last night 1 hour apart for three times to get him asleep.  Called pt back and got his voicemail and asked him to call me so we can speak on the phone.

## 2015-07-30 ENCOUNTER — Ambulatory Visit: Payer: Self-pay

## 2015-08-03 ENCOUNTER — Other Ambulatory Visit: Payer: Self-pay

## 2015-08-03 ENCOUNTER — Ambulatory Visit: Payer: Self-pay | Admitting: Hematology and Oncology

## 2015-08-03 ENCOUNTER — Ambulatory Visit: Payer: Self-pay

## 2015-08-04 ENCOUNTER — Ambulatory Visit: Payer: Self-pay

## 2015-08-05 ENCOUNTER — Ambulatory Visit: Payer: Self-pay

## 2015-08-06 ENCOUNTER — Other Ambulatory Visit: Payer: Self-pay | Admitting: Hematology and Oncology

## 2015-08-06 ENCOUNTER — Ambulatory Visit
Admission: RE | Admit: 2015-08-06 | Discharge: 2015-08-06 | Disposition: A | Discharge status: Home / Self Care | Payer: Medicaid Other | Source: Ambulatory Visit | Attending: Hematology and Oncology | Admitting: Hematology and Oncology

## 2015-08-06 ENCOUNTER — Ambulatory Visit: Payer: Self-pay

## 2015-08-06 DIAGNOSIS — J189 Pneumonia, unspecified organism: Secondary | ICD-10-CM | POA: Diagnosis not present

## 2015-08-06 DIAGNOSIS — C3491 Malignant neoplasm of unspecified part of right bronchus or lung: Secondary | ICD-10-CM

## 2015-08-06 DIAGNOSIS — C787 Secondary malignant neoplasm of liver and intrahepatic bile duct: Secondary | ICD-10-CM | POA: Diagnosis not present

## 2015-08-06 DIAGNOSIS — C3411 Malignant neoplasm of upper lobe, right bronchus or lung: Secondary | ICD-10-CM | POA: Insufficient documentation

## 2015-08-06 MED ORDER — IOPAMIDOL (ISOVUE-370) INJECTION 76%
100.0000 mL | Freq: Once | INTRAVENOUS | Status: AC | PRN
Start: 2015-08-06 — End: 2015-08-06
  Administered 2015-08-06: 100 mL via INTRAVENOUS

## 2015-08-09 ENCOUNTER — Telehealth: Payer: Self-pay | Admitting: *Deleted

## 2015-08-09 NOTE — Telephone Encounter (Signed)
Called pt back and told him that insurance had not approved the pet scan and dr Mike Gip had to call and speak to MD at insurance for approval and we got it [id number].  By the time it was approved the pet scan had been cancelled due to insurance not approved.  The only appt I cna get fo rhim is 5/10 at 8 am for 8:30 scan and already went over the instructions with him.  Also we will cancel his chemo until we know the results of the pet.  Pt to come on Thursday to see md.at 8:30 pt aware and anixious about all the changes but I let him know this is the best plan until we get the results of PET, we do not want to treat him with meds that might not work for him.  He is agreeable to plan

## 2015-08-09 NOTE — Telephone Encounter (Signed)
Called pt to let him know that he needs PET scan. The reason why is that all the area that we found originally to have cancer are either stable or dec.  But there is one area that is new in rul of lung that shows anterior medial subpleural soft tissues that could be postobstructive pneumonitis and could be worrisome for tumor.  So it is considered a mixed response to therapy.  After [place] speaking to radiologist it is rec: pet scan to get clarification to whether there is a new tumor or not.  Pt agreeable to the pet scan and I will work on changing his md appt to afternoon after the pet scan done.  Pt agreeable and I will work on changing his appt to see md

## 2015-08-10 ENCOUNTER — Ambulatory Visit: Admission: RE | Admit: 2015-08-10 | Payer: Medicaid Other | Source: Ambulatory Visit

## 2015-08-10 ENCOUNTER — Inpatient Hospital Stay: Payer: Medicaid Other

## 2015-08-10 ENCOUNTER — Inpatient Hospital Stay: Payer: Medicaid Other | Admitting: Hematology and Oncology

## 2015-08-10 ENCOUNTER — Encounter: Payer: Self-pay | Admitting: Hematology and Oncology

## 2015-08-11 ENCOUNTER — Encounter
Admission: RE | Admit: 2015-08-11 | Discharge: 2015-08-11 | Disposition: A | Discharge status: Home / Self Care | Payer: Medicaid Other | Source: Ambulatory Visit | Attending: Hematology and Oncology | Admitting: Hematology and Oncology

## 2015-08-11 ENCOUNTER — Other Ambulatory Visit: Payer: Self-pay | Admitting: Hematology and Oncology

## 2015-08-11 ENCOUNTER — Inpatient Hospital Stay: Payer: Medicaid Other

## 2015-08-11 DIAGNOSIS — C3491 Malignant neoplasm of unspecified part of right bronchus or lung: Secondary | ICD-10-CM | POA: Diagnosis present

## 2015-08-11 DIAGNOSIS — C3490 Malignant neoplasm of unspecified part of unspecified bronchus or lung: Secondary | ICD-10-CM

## 2015-08-11 LAB — GLUCOSE, CAPILLARY: Glucose-Capillary: 131 mg/dL — ABNORMAL HIGH (ref 65–99)

## 2015-08-11 MED ORDER — FLUDEOXYGLUCOSE F - 18 (FDG) INJECTION
11.9300 | Freq: Once | INTRAVENOUS | Status: AC | PRN
  Administered 2015-08-11: 11.93 via INTRAVENOUS

## 2015-08-12 ENCOUNTER — Ambulatory Visit
Admission: RE | Admit: 2015-08-12 | Discharge: 2015-08-12 | Disposition: A | Discharge status: Home / Self Care | Payer: Medicaid Other | Source: Ambulatory Visit | Attending: Hematology and Oncology | Admitting: Hematology and Oncology

## 2015-08-12 ENCOUNTER — Inpatient Hospital Stay: Payer: Medicaid Other

## 2015-08-12 ENCOUNTER — Inpatient Hospital Stay: Payer: Medicaid Other | Attending: Hematology and Oncology | Admitting: Hematology and Oncology

## 2015-08-12 ENCOUNTER — Encounter: Payer: Self-pay | Admitting: Hematology and Oncology

## 2015-08-12 ENCOUNTER — Telehealth: Payer: Self-pay

## 2015-08-12 VITALS — BP 150/97 | HR 72 | Temp 97.1°F | Resp 18 | Ht 70.5 in | Wt 210.4 lb

## 2015-08-12 DIAGNOSIS — C414 Malignant neoplasm of pelvic bones, sacrum and coccyx: Secondary | ICD-10-CM

## 2015-08-12 DIAGNOSIS — I714 Abdominal aortic aneurysm, without rupture: Secondary | ICD-10-CM

## 2015-08-12 DIAGNOSIS — M899 Disorder of bone, unspecified: Secondary | ICD-10-CM | POA: Diagnosis not present

## 2015-08-12 DIAGNOSIS — I7090 Unspecified atherosclerosis: Secondary | ICD-10-CM | POA: Diagnosis not present

## 2015-08-12 DIAGNOSIS — C3491 Malignant neoplasm of unspecified part of right bronchus or lung: Secondary | ICD-10-CM

## 2015-08-12 DIAGNOSIS — R0781 Pleurodynia: Secondary | ICD-10-CM | POA: Diagnosis not present

## 2015-08-12 DIAGNOSIS — C787 Secondary malignant neoplasm of liver and intrahepatic bile duct: Secondary | ICD-10-CM | POA: Insufficient documentation

## 2015-08-12 DIAGNOSIS — Z808 Family history of malignant neoplasm of other organs or systems: Secondary | ICD-10-CM | POA: Diagnosis not present

## 2015-08-12 DIAGNOSIS — C3411 Malignant neoplasm of upper lobe, right bronchus or lung: Secondary | ICD-10-CM | POA: Insufficient documentation

## 2015-08-12 DIAGNOSIS — M79606 Pain in leg, unspecified: Secondary | ICD-10-CM | POA: Diagnosis not present

## 2015-08-12 DIAGNOSIS — R51 Headache: Secondary | ICD-10-CM | POA: Diagnosis not present

## 2015-08-12 DIAGNOSIS — Z452 Encounter for adjustment and management of vascular access device: Secondary | ICD-10-CM | POA: Insufficient documentation

## 2015-08-12 DIAGNOSIS — C3490 Malignant neoplasm of unspecified part of unspecified bronchus or lung: Secondary | ICD-10-CM

## 2015-08-12 DIAGNOSIS — I10 Essential (primary) hypertension: Secondary | ICD-10-CM | POA: Insufficient documentation

## 2015-08-12 DIAGNOSIS — Z5111 Encounter for antineoplastic chemotherapy: Secondary | ICD-10-CM | POA: Insufficient documentation

## 2015-08-12 DIAGNOSIS — E0590 Thyrotoxicosis, unspecified without thyrotoxic crisis or storm: Secondary | ICD-10-CM | POA: Insufficient documentation

## 2015-08-12 DIAGNOSIS — G893 Neoplasm related pain (acute) (chronic): Secondary | ICD-10-CM

## 2015-08-12 DIAGNOSIS — F17210 Nicotine dependence, cigarettes, uncomplicated: Secondary | ICD-10-CM | POA: Diagnosis not present

## 2015-08-12 DIAGNOSIS — Z79899 Other long term (current) drug therapy: Secondary | ICD-10-CM | POA: Diagnosis not present

## 2015-08-12 MED ORDER — NICOTINE 21 MG/24HR TD PT24: 21.0000 mg | MEDICATED_PATCH | Freq: Every day | TRANSDERMAL | Status: DC

## 2015-08-12 MED ORDER — OXYCODONE HCL 10 MG PO TABS: 10.0000 mg | ORAL_TABLET | ORAL | Status: DC | PRN

## 2015-08-12 NOTE — Telephone Encounter (Signed)
Called pt per MD that Dr. Lucky Cowboy stated port is in good placement and no other concerns were noted.

## 2015-08-12 NOTE — Progress Notes (Signed)
Pt reports that leg and back hurt more when lying down at night and would like refill on pain medication.  Not coughing up anything much anymore according to pt every once in while.  When he does cough it up looks like pollen.  Concerned and nervous about results.

## 2015-08-12 NOTE — Progress Notes (Signed)
[facility] day:  08/12/2015   Chief Complaint: Chris Mason is a 55 y.o. male with extensive stage small cell lung cancer who is seen for reassessment after 2 cyles of carboplatin and etoposide.  HPI:   The patient was last seen by me in the medical oncology clinic on 07/20/2015.  At that time, he received cycle #2 carboplatin and etoposide.  CEA had decreased from 912.6 to 429.8.  Symptomatically, he felt like his pain was worse after the delay in his chemotherapy.  He underwent restaging chest, abdomen, and pelvic CT scan on 08/06/2015.  He has had a mixed response to therapy. The primary bronchogenic carcinoma the right upper lobe was mildly decreased in size.  There was a questionable new anterior medial subpleural soft tissue density in the right upper lobe resulting and postobstructive pneumonitis, worrisome for tumor.  There was stable right paratracheal adenopathy.  There was stable to slightly improved appearance of multifocal liver metastasis.  There was stable appearance of lytic lesion involving the left pubic bone.  Given the question of progression, a PET scan was recommended by radiology.  PET scan on 08/11/2015 revealed questionable slight progression compared to prior examinations with interval enlargement of pulmonary nodules in the right upper lobe, and slight interval growth of the large metastatic lesion within the right lobe of the liver (difficult to assess on PET). Other metastatic lesions to the bones appear very similar. There was interval symmetric soft tissue thickening and hypermetabolism in the antrum of the stomach.  There was atherosclerosis, including a fusiform 3.4 cm infrarenal abdominal aortic aneurysm.  Review of imaging with Dr. Laural Roes revealed no clear progression of disease.  The liver lesions are best seen on contrast CT scan are felt stable to slightly improved.  Symptomatically, he feels "too good".  He  recently picked up Nexium for his stomach.     Past Medical History  Diagnosis Date  . Hypertension   . Hyperthyroidism   . Cancer ([place])     right lung mass, left pubic bone lesion, hepatic mass    Past Surgical History  Procedure Laterality Date  . Elbow arthroplasty    . Peripheral vascular catheterization N/A 07/08/2015    Procedure: Glori Luis Cath Insertion;  Surgeon: Algernon Huxley, MD;  Location: [facility];  Service: Cardiovascular;  Laterality: N/A;    Family History  Problem Relation Age of Onset  . Hypertension Mother   . Cancer Paternal Aunt     Breast     Social History:  reports that he has been smoking Cigarettes.  He has a 17.5 pack-year smoking history. He does not have any smokeless tobacco history on file. He reports that he does not drink alcohol or use illicit drugs.  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  The patient is accompanied by his girlfriend today.  Allergies: No Known Allergies  Current Medications: Current Outpatient Prescriptions  Medication Sig Dispense Refill  . lidocaine-prilocaine (EMLA) cream Apply 1 application topically as needed. Apply 1/2 Tablespoon 1-2 hours before port use and cover. 30 g 0  . LORazepam (ATIVAN) 1 MG tablet 1/2 tablet to 1 tablet every 6 hours as needed for anxiety 40 tablet 0  . losartan-hydrochlorothiazide (HYZAAR) 50-12.5 MG per tablet Take 1 tablet by mouth daily.    . magic mouthwash w/lidocaine SOLN Take 10 mLs by mouth 4 (four)  times daily as needed for mouth pain. 480 mL 3  . oxyCODONE 10 MG TABS Take 1 tablet (10 mg total) by mouth every 4 (four) hours as needed for severe pain. 60 tablet 0   No current facility-administered medications for this visit.   Review of Systems:  GENERAL:  Feels "too good".  No fevers or sweats.  Weight down 3 pounds. PERFORMANCE STATUS (ECOG):  1 HEENT:  No visual changes, runny nose, sore throat, mouth  sores or tenderness. Lungs:  No shortness of breath or cough.  No hemoptysis. Cardiac:  No chest pain, palpitations, orthopnea, or PND. GI:  No nausea, vomiting, diarrhea, constipation, melena or hematochezia.  No prior colonoscopy. GU:  No urgency, frequency, dysuria, or hematuria. Musculoskeletal:  Left hip pain and back pain, more uncomfortable when lying down.  No muscle tenderness. Extremities:  No pain or swelling. Skin:  No rashes, skin changes, or ulcers. Neuro:  No headaches, numbness or weakness, balance or coordination issues. Endocrine:  No diabetes, thyroid issues, hot flashes or night sweats. Psych:  Anxiety.  No mood changes or depression. Pain:  No focal pain. Review of systems:  All other systems reviewed and found to be negative.  Physical Exam: Blood pressure 150/97, pulse 72, temperature 97.1 F (36.2 C), temperature source Tympanic, resp. rate 18, height 5' 10.5" (1.791 m), weight 210 lb 6.9 oz (95.45 kg). GENERAL:  Well developed, well nourished, moving about in the exam room in no acute distress.  MENTAL STATUS:  Alert and oriented to person, place and time. HEAD:  Wearing a tennis cap.  Short brown hair thinning with graying goatee.  Normocephalic, atraumatic, face symmetric, no Cushingoid features. EYES:  Pupils equal round and reactive to light and accomodation.  No conjunctivitis or scleral icterus. ENT:  Oropharynx clear without lesion. s/p dental extractions, well healing.  Tongue normal. Mucous membranes moist.  RESPIRATORY:  Clear to auscultation without rales, wheezes or rhonchi. CARDIOVASCULAR:  Regular rate and rhythm without murmur, rub or gallop. CHEST:  Port-a-cath site unremarkable.  Port-a-cath line in neck appears normal. ABDOMEN:  Soft, non-tender, with active bowel sounds, and no hepatosplenomegaly.  No masses. SKIN:  No rashes, ulcers or lesions. EXTREMITIES: No edema, no skin discoloration or tenderness.  No palpable cords. LYMPH NODES: No  palpable cervical, supraclavicular, axillary or inguinal adenopathy  NEUROLOGICAL: Unremarkable. PSYCH:  Appropriate.   Hospital Outpatient Visit on 08/11/2015  Component Date Value Ref Range Status  . Glucose-Capillary 08/11/2015 131* 65 - 99 mg/dL Final    Assessment:  Chris Mason is a 55 y.o. male with extensive stage small cell lung cancer.  He presented with a 2 month history of left hip pain.  CXR revealed a nodular density in the right upper lobe.  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    CT scan of the left hip on 06/04/2015 revealed a 3.3 x 2.5 cm destructive lesion in the left parasymphyseal pubic bone and superior pubic ramus.  Surrounding sift tissues demonstrated a low-attenuation soft tissue component of the lesion.  There was subtle lucency of the left greater trochanter and a small focus of cortical destruction along the anterior aspect of the greater trochanter.  There was also lucency in the periphery of the left femoral head without cortical disruption.  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 There was a dominant 2.0 x 1.8 cm right upper lobe pulmonary nodule (SUV 8.9)  with thoracic nodal, hepatic, and multifocal osseous metastasis.  Examples within the left pubic bone at a SUV of 11.3, left transverse process at T7 (SUV of 8.9), C4 vertebral body (SUV of 5.7), lateral eighth left rib (SUV of 6.3.).  There was a 3.8 cm right hepatic lobe hypo-attenuating mass (SUV 8.4).    CEA was 912.6 on 06/23/2015 and 429.8 on 07/13/2015.  LDH was 205 on 06/23/2015 and 127 on 07/13/2015.  Head MRI on 06/16/2015 revealed no evidence for metastatic disease.   He receives Niger monthly (began 06/23/2015).  He underwent 2 dental extractions on 07/13/2015.  He is on amoxicillin for dental abscesses.  He is s/p 2 cycles of carboplatin and etoposide (06/22/2105  -07/20/2015) with Neulasta support.   Chest, abdomen, and pelvic CT scan on 08/06/2015 suggested a mixed response to therapy. The primary bronchogenic carcinoma the right upper lobe was mildly decreased in size.  There was a questionable new anterior medial subpleural soft tissue density in the right upper lobe.  There was stable right paratracheal adenopathy.  There was stable to slightly improved appearance of multifocal liver metastasis.  There was stable appearance of lytic lesion involving the left pubic bone.  PET scan on 08/11/2015 revealed questionable slight progression compared to prior examinations with interval enlargement of pulmonary nodules in the right upper lobe, and slight interval growth of the large metastatic lesion within the right lobe of the liver (difficult to assess on PET). Other metastatic lesions to the bones appear very similar.  Review of imaging with radiology revealed no clear progression.  PET scan suggested that his port-a-cath line was coiled in his neck.  In addition, there was symmetric soft tissue thickening and hypermetabolism in the antrum of the stomach.  He recently started Nexium.  Symptomatically, he feels pretty very good.  Exam is stable.  He is interested in smoking cessation.  Plan: 1.  Review CT scans and PET scan.   2.  Disucss plans for restaging CT scans after 2 more cycles. 3.  Review imaging at tumor board today. 4.  CXR (PA and lateral)- assess line placement.  5.  Contact Dr. Lucky Cowboy once CXR available. 6.  Rx:  oxycodone 10 mg po q 4-6 hours prn pain; Dis #60. 7.  Rx:  nicotine patch 21 mg/24 hours; Dis #28. 8.  RTC on 08/16/2015 for MD assessment, labs (CBC with diff, CMP, Mg, CEA), and cycle #3 carboplatin and etoposide.   Lequita Asal, MD  08/12/2015, 9:10 AM

## 2015-08-13 ENCOUNTER — Ambulatory Visit: Payer: Self-pay

## 2015-08-14 ENCOUNTER — Encounter: Payer: Self-pay | Admitting: Hematology and Oncology

## 2015-08-15 ENCOUNTER — Other Ambulatory Visit: Payer: Self-pay | Admitting: Hematology and Oncology

## 2015-08-16 ENCOUNTER — Inpatient Hospital Stay (HOSPITAL_BASED_OUTPATIENT_CLINIC_OR_DEPARTMENT_OTHER): Payer: Medicaid Other | Admitting: Hematology and Oncology

## 2015-08-16 ENCOUNTER — Inpatient Hospital Stay: Payer: Medicaid Other

## 2015-08-16 VITALS — BP 139/92 | HR 67 | Temp 97.0°F | Resp 18 | Ht 70.5 in | Wt 212.5 lb

## 2015-08-16 DIAGNOSIS — R0781 Pleurodynia: Secondary | ICD-10-CM | POA: Diagnosis not present

## 2015-08-16 DIAGNOSIS — C3411 Malignant neoplasm of upper lobe, right bronchus or lung: Secondary | ICD-10-CM

## 2015-08-16 DIAGNOSIS — C3491 Malignant neoplasm of unspecified part of right bronchus or lung: Secondary | ICD-10-CM

## 2015-08-16 DIAGNOSIS — C414 Malignant neoplasm of pelvic bones, sacrum and coccyx: Secondary | ICD-10-CM

## 2015-08-16 DIAGNOSIS — M79606 Pain in leg, unspecified: Secondary | ICD-10-CM | POA: Diagnosis not present

## 2015-08-16 DIAGNOSIS — Z5111 Encounter for antineoplastic chemotherapy: Secondary | ICD-10-CM | POA: Diagnosis not present

## 2015-08-16 DIAGNOSIS — M899 Disorder of bone, unspecified: Secondary | ICD-10-CM

## 2015-08-16 DIAGNOSIS — F17210 Nicotine dependence, cigarettes, uncomplicated: Secondary | ICD-10-CM

## 2015-08-16 DIAGNOSIS — Z808 Family history of malignant neoplasm of other organs or systems: Secondary | ICD-10-CM

## 2015-08-16 DIAGNOSIS — C787 Secondary malignant neoplasm of liver and intrahepatic bile duct: Secondary | ICD-10-CM

## 2015-08-16 DIAGNOSIS — E0590 Thyrotoxicosis, unspecified without thyrotoxic crisis or storm: Secondary | ICD-10-CM

## 2015-08-16 DIAGNOSIS — R51 Headache: Secondary | ICD-10-CM

## 2015-08-16 DIAGNOSIS — Z79899 Other long term (current) drug therapy: Secondary | ICD-10-CM

## 2015-08-16 DIAGNOSIS — I10 Essential (primary) hypertension: Secondary | ICD-10-CM

## 2015-08-16 DIAGNOSIS — I7090 Unspecified atherosclerosis: Secondary | ICD-10-CM

## 2015-08-16 DIAGNOSIS — I714 Abdominal aortic aneurysm, without rupture: Secondary | ICD-10-CM

## 2015-08-16 LAB — COMPREHENSIVE METABOLIC PANEL
ALT: 23 U/L (ref 17–63)
AST: 19 U/L (ref 15–41)
Albumin: 3.8 g/dL (ref 3.5–5.0)
Alkaline Phosphatase: 77 U/L (ref 38–126)
Anion gap: 8 (ref 5–15)
BUN: 20 mg/dL (ref 6–20)
CO2: 26 mmol/L (ref 22–32)
Calcium: 8.8 mg/dL — ABNORMAL LOW (ref 8.9–10.3)
Chloride: 103 mmol/L (ref 101–111)
Creatinine, Ser: 0.67 mg/dL (ref 0.61–1.24)
GFR calc Af Amer: >60 mL/min (ref >60)
GFR calc non Af Amer: >60 mL/min (ref >60)
Glucose, Bld: 140 mg/dL — ABNORMAL HIGH (ref 65–99)
Potassium: 4.2 mmol/L (ref 3.5–5.1)
Sodium: 137 mmol/L (ref 135–145)
Total Bilirubin: 0.5 mg/dL (ref 0.3–1.2)
Total Protein: 7.7 g/dL (ref 6.5–8.1)

## 2015-08-16 LAB — CBC WITH DIFFERENTIAL/PLATELET
Basophils Absolute: 0.1 10*3/uL (ref 0–0.1)
Basophils Relative: 1 %
Eosinophils Absolute: 0.2 10*3/uL (ref 0–0.7)
Eosinophils Relative: 1 %
HCT: 40.2 % (ref 40.0–52.0)
Hemoglobin: 14.1 g/dL (ref 13.0–18.0)
Lymphocytes Relative: 24 %
Lymphs Abs: 2.8 10*3/uL (ref 1.0–3.6)
MCH: 30.3 pg (ref 26.0–34.0)
MCHC: 35 g/dL (ref 32.0–36.0)
MCV: 86.6 fL (ref 80.0–100.0)
Monocytes Absolute: 1.3 10*3/uL — ABNORMAL HIGH (ref 0.2–1.0)
Monocytes Relative: 11 %
Neutro Abs: 7.2 10*3/uL — ABNORMAL HIGH (ref 1.4–6.5)
Neutrophils Relative %: 63 %
Platelets: 412 10*3/uL (ref 150–440)
RBC: 4.65 MIL/uL (ref 4.40–5.90)
RDW: 14.6 % — ABNORMAL HIGH (ref 11.5–14.5)
WBC: 11.5 10*3/uL — ABNORMAL HIGH (ref 3.8–10.6)

## 2015-08-16 LAB — MAGNESIUM: Magnesium: 2.2 mg/dL (ref 1.7–2.4)

## 2015-08-16 MED ORDER — OXYCODONE HCL 5 MG PO TABS
5.0000 mg | ORAL_TABLET | Freq: Once | ORAL | Status: AC
  Administered 2015-08-16: 5 mg via ORAL
  Filled 2015-08-16: qty 1

## 2015-08-16 MED ORDER — SODIUM CHLORIDE 0.9 % IV SOLN
750.0000 mg | Freq: Once | INTRAVENOUS | Status: AC
  Administered 2015-08-16: 750 mg via INTRAVENOUS
  Filled 2015-08-16: qty 75

## 2015-08-16 MED ORDER — PALONOSETRON HCL INJECTION 0.25 MG/5ML
0.2500 mg | Freq: Once | INTRAVENOUS | Status: AC
  Administered 2015-08-16: 0.25 mg via INTRAVENOUS
  Filled 2015-08-16: qty 5

## 2015-08-16 MED ORDER — ETOPOSIDE CHEMO INJECTION 1 GM/50ML
100.0000 mg/m2 | Freq: Once | INTRAVENOUS | Status: AC
  Administered 2015-08-16: 220 mg via INTRAVENOUS
  Filled 2015-08-16: qty 11

## 2015-08-16 MED ORDER — SODIUM CHLORIDE 0.9 % IV SOLN
10.0000 mg | Freq: Once | INTRAVENOUS | Status: AC
  Administered 2015-08-16: 10 mg via INTRAVENOUS
  Filled 2015-08-16: qty 1

## 2015-08-16 MED ORDER — HEPARIN SOD (PORK) LOCK FLUSH 100 UNIT/ML IV SOLN
500.0000 [IU] | Freq: Once | INTRAVENOUS | Status: AC | PRN
Start: 2015-08-16 — End: 2015-08-16
  Administered 2015-08-16: 500 [IU]
  Filled 2015-08-16: qty 5

## 2015-08-16 MED ORDER — SODIUM CHLORIDE 0.9 % IV SOLN
Freq: Once | INTRAVENOUS | Status: AC
  Administered 2015-08-16: 11:00:00 via INTRAVENOUS
  Filled 2015-08-16: qty 1000

## 2015-08-16 MED ORDER — SODIUM CHLORIDE 0.9% FLUSH
10.0000 mL | INTRAVENOUS | Status: DC | PRN
  Administered 2015-08-16: 10 mL
  Filled 2015-08-16: qty 10

## 2015-08-16 NOTE — Progress Notes (Signed)
[facility] day:  08/16/2015   Chief Complaint: Chris Mason is a 55 y.o. male with extensive stage small cell lung cancer who is seen for assessment prior to cycle #3 carboplatin and etoposide.  HPI:   The patient was last seen by in the medical oncology clinic on 08/12/2015.  At that time, restaging studies were reviewed.  As there was no evidence of clear progression, decision was made to proceed with cycle #3 chemotherapy.  Symptomatically, he notes left sided rib pain and discomfort in his left leg which he relates to the delay in his chemotherapy.  He states that he has to sleep on hsi elbow.  He states that he quit smoking yesterday.  He has not started the nicotine patch.  He also notes a transient headache.    Past Medical History  Diagnosis Date  . Hypertension   . Hyperthyroidism   . Cancer ([place])     right lung mass, left pubic bone lesion, hepatic mass    Past Surgical History  Procedure Laterality Date  . Elbow arthroplasty    . Peripheral vascular catheterization N/A 07/08/2015    Procedure: Glori Luis Cath Insertion;  Surgeon: Algernon Huxley, MD;  Location: [facility];  Service: Cardiovascular;  Laterality: N/A;    Family History  Problem Relation Age of Onset  . Hypertension Mother   . Cancer Paternal Aunt     Breast     Social History:  reports that he has been smoking Cigarettes.  He has a 17.5 pack-year smoking history. He does not have any smokeless tobacco history on file. He reports that he does not drink alcohol or use illicit drugs.  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  The patient is accompanied by his girlfriend today.  Allergies: No Known Allergies  Current Medications: Current Outpatient Prescriptions  Medication Sig Dispense Refill  . lidocaine-prilocaine (EMLA) cream Apply 1 application topically as needed. Apply 1/2  Tablespoon 1-2 hours before port use and cover. 30 g 0  . LORazepam (ATIVAN) 1 MG tablet 1/2 tablet to 1 tablet every 6 hours as needed for anxiety 40 tablet 0  . losartan-hydrochlorothiazide (HYZAAR) 50-12.5 MG per tablet Take 1 tablet by mouth daily.    . nicotine (NICODERM CQ) 21 mg/24hr patch Place 1 patch (21 mg total) onto the skin daily. 28 patch 0  . Oxycodone HCl 10 MG TABS Take 1 tablet (10 mg total) by mouth every 4 (four) hours as needed. 60 tablet 0  . magic mouthwash w/lidocaine SOLN Take 10 mLs by mouth 4 (four) times daily as needed for mouth pain. (Patient not taking: Reported on 08/16/2015) 480 mL 3   No current facility-administered medications for this visit.   Review of Systems:  GENERAL:  Feels "ok".  No fevers or sweats.  Weight up 2 pounds. PERFORMANCE STATUS (ECOG):  1 HEENT:  No visual changes, runny nose, sore throat, mouth sores or tenderness. Lungs:  No shortness of breath or cough.  No hemoptysis. Cardiac:  No chest pain, palpitations, orthopnea, or PND. GI:  No nausea, vomiting, diarrhea, constipation, melena or hematochezia.  No prior colonoscopy. GU:  No urgency, frequency, dysuria, or hematuria. Musculoskeletal:  Left hip pain and rib pain.  No muscle tenderness. Extremities:  No pain or swelling. Skin:  No rashes, skin changes, or ulcers. Neuro:  Transient headache.  No numbness  or weakness, balance or coordination issues. Endocrine:  No diabetes, thyroid issues, hot flashes or night sweats. Psych:  Anxiety.  No mood changes or depression. Pain:  No focal pain. Review of systems:  All other systems reviewed and found to be negative.  Physical Exam: Blood pressure 139/92, pulse 67, temperature 97 F (36.1 C), temperature source Tympanic, resp. rate 18, height 5' 10.5" (1.791 m), weight 212 lb 8.4 oz (96.4 kg), SpO2 96 %. GENERAL:  Well developed, well nourished, moving about in the exam room in no acute distress.  MENTAL STATUS:  Alert and oriented to  person, place and time. HEAD:  Short brown hair thinning with graying goatee.  Normocephalic, atraumatic, face symmetric, no Cushingoid features. EYES:  Pupils equal round and reactive to light and accomodation.  No conjunctivitis or scleral icterus. ENT:  Oropharynx clear without lesion. s/p dental extractions, well healing.  Tongue normal. Mucous membranes moist.  RESPIRATORY:  Clear to auscultation without rales, wheezes or rhonchi. CARDIOVASCULAR:  Regular rate and rhythm without murmur, rub or gallop. CHEST:  Port-a-cath site unremarkable.  Port-a-cath line in neck appears normal. ABDOMEN:  Soft, non-tender, with active bowel sounds, and no hepatosplenomegaly.  No masses. SKIN:  No rashes, ulcers or lesions. EXTREMITIES: No edema, no skin discoloration or tenderness.  No palpable cords. LYMPH NODES: No palpable cervical, supraclavicular, axillary or inguinal adenopathy  NEUROLOGICAL: Unremarkable. PSYCH:  Appropriate.   Appointment on 08/16/2015  Component Date Value Ref Range Status  . WBC 08/16/2015 11.5* 3.8 - 10.6 K/uL Final  . RBC 08/16/2015 4.65  4.40 - 5.90 MIL/uL Final  . Hemoglobin 08/16/2015 14.1  13.0 - 18.0 g/dL Final  . HCT 08/16/2015 40.2  40.0 - 52.0 % Final  . MCV 08/16/2015 86.6  80.0 - 100.0 fL Final  . MCH 08/16/2015 30.3  26.0 - 34.0 pg Final  . MCHC 08/16/2015 35.0  32.0 - 36.0 g/dL Final  . RDW 08/16/2015 14.6* 11.5 - 14.5 % Final  . Platelets 08/16/2015 412  150 - 440 K/uL Final  . Neutrophils Relative % 08/16/2015 63   Final  . Neutro Abs 08/16/2015 7.2* 1.4 - 6.5 K/uL Final  . Lymphocytes Relative 08/16/2015 24   Final  . Lymphs Abs 08/16/2015 2.8  1.0 - 3.6 K/uL Final  . Monocytes Relative 08/16/2015 11   Final  . Monocytes Absolute 08/16/2015 1.3* 0.2 - 1.0 K/uL Final  . Eosinophils Relative 08/16/2015 1   Final  . Eosinophils Absolute 08/16/2015 0.2  0 - 0.7 K/uL Final  . Basophils Relative 08/16/2015 1   Final  . Basophils Absolute 08/16/2015 0.1   0 - 0.1 K/uL Final  . Sodium 08/16/2015 137  135 - 145 mmol/L Final  . Potassium 08/16/2015 4.2  3.5 - 5.1 mmol/L Final  . Chloride 08/16/2015 103  101 - 111 mmol/L Final  . CO2 08/16/2015 26  22 - 32 mmol/L Final  . Glucose, Bld 08/16/2015 140* 65 - 99 mg/dL Final  . BUN 08/16/2015 20  6 - 20 mg/dL Final  . Creatinine, Ser 08/16/2015 0.67  0.61 - 1.24 mg/dL Final  . Calcium 08/16/2015 8.8* 8.9 - 10.3 mg/dL Final  . Total Protein 08/16/2015 7.7  6.5 - 8.1 g/dL Final  . Albumin 08/16/2015 3.8  3.5 - 5.0 g/dL Final  . AST 08/16/2015 19  15 - 41 U/L Final  . ALT 08/16/2015 23  17 - 63 U/L Final  . Alkaline Phosphatase 08/16/2015 77  38 - 126 U/L Final  .  Total Bilirubin 08/16/2015 0.5  0.3 - 1.2 mg/dL Final  . GFR calc non Af Amer 08/16/2015 >60  >60 mL/min Final  . GFR calc Af Amer 08/16/2015 >60  >60 mL/min Final   Comment: (NOTE) The eGFR has been calculated using the CKD EPI equation. This calculation has not been validated in all clinical situations. eGFR's persistently <60 mL/min signify possible Chronic Kidney Disease.   . Anion gap 08/16/2015 8  5 - 15 Final  . Magnesium 08/16/2015 2.2  1.7 - 2.4 mg/dL Final    Assessment:  TANUSH DREES is a 56 y.o. male with extensive stage small cell lung cancer.  He presented with a 2 month history of left hip pain.  CXR revealed a nodular density in the right upper lobe.  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    CT scan of the left hip on 06/04/2015 revealed a 3.3 x 2.5 cm destructive lesion in the left parasymphyseal pubic bone and superior pubic ramus.  Surrounding sift tissues demonstrated a low-attenuation soft tissue component of the lesion.  There was subtle lucency of the left greater trochanter and a small focus of cortical destruction along the anterior aspect of the greater trochanter.  There was also lucency in the periphery of the left femoral head without cortical  disruption.  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 There was a dominant 2.0 x 1.8 cm right upper lobe pulmonary nodule (SUV 8.9) with thoracic nodal, hepatic, and multifocal osseous metastasis.  Examples within the left pubic bone at a SUV of 11.3, left transverse process at T7 (SUV of 8.9), C4 vertebral body (SUV of 5.7), lateral eighth left rib (SUV of 6.3.).  There was a 3.8 cm right hepatic lobe hypo-attenuating mass (SUV 8.4).    CEA was 912.6 on 06/23/2015 and 429.8 on 07/13/2015.  LDH was 205 on 06/23/2015 and 127 on 07/13/2015.  Head MRI on 06/16/2015 revealed no evidence for metastatic disease.   He receives Niger monthly (began 06/23/2015).  He underwent 2 dental extractions on 07/13/2015.  He is s/p 2 cycles of carboplatin and etoposide (06/22/2105 -07/20/2015) with Neulasta support.   Chest, abdomen, and pelvic CT scan on 08/06/2015 suggested a mixed response to therapy. The primary bronchogenic carcinoma the right upper lobe was mildly decreased in size.  There was a questionable new anterior medial subpleural soft tissue density in the right upper lobe.  There was stable right paratracheal adenopathy.  There was stable to slightly improved appearance of multifocal liver metastasis.  There was stable appearance of lytic lesion involving the left pubic bone.  PET scan on 08/11/2015 revealed questionable slight progression compared to prior examinations with interval enlargement of pulmonary nodules in the right upper lobe, and slight interval growth of the large metastatic lesion within the right lobe of the liver (difficult to assess on PET). Other metastatic lesions to the bones appear very similar.  Review of imaging with radiology revealed no clear progression.  PET scan suggested that his port-a-cath line was coiled in his neck.  CXR revealed adequate port placement.  In addition, there was symmetric  soft tissue thickening and hypermetabolism in the antrum of the stomach.  He recently started Nexium.  Symptomatically, he has some left rib and leg discomfort.  Exam is stable.  He stopped smoking yesterday.  Plan: 1.  Labs today:  CBC with diff, CMP, CEA, Mg.   2.  Begin cycle #3 carboplatin and etoposide (day 1-3) with On-Pro Neulasta on 08/18/2015. 3.  Follow-up with oral surgeon (Dr Annamaria Helling [phone]) regarding reinstitution of Xgeva. 4.  RTC in 3 weeks for MD assessment, labs (CBC with diff, CMP, Mg, CEA), and cycle #4 carboplatin and etoposide.  Addendum:  I spoke with Dr. Hampton Abbot.  He felt that he could reinstitute Xgeva at any time.   Lequita Asal, MD  08/16/2015, 10:25 AM

## 2015-08-16 NOTE — Progress Notes (Signed)
Pt requested a dietician consult so he can eat foods that will help fight against cancer. He has an appetite, he is not loosing wt. He wants to eat healthy for cancer. Ordered entered.

## 2015-08-16 NOTE — Progress Notes (Signed)
Pt reports he get pimples or blisters on chin but are gone by the time pt gets to clinic.  Pt reports on left side rib cage hurts more upon laying down and it can 9/10 on pain scale.  Left leg has started hurting again and can 1 out 10 on pain scale.

## 2015-08-17 ENCOUNTER — Inpatient Hospital Stay: Payer: Medicaid Other

## 2015-08-17 ENCOUNTER — Telehealth: Payer: Self-pay | Admitting: *Deleted

## 2015-08-17 ENCOUNTER — Other Ambulatory Visit: Payer: Self-pay | Admitting: Hematology and Oncology

## 2015-08-17 VITALS — BP 123/74 | HR 60 | Temp 98.0°F | Resp 20

## 2015-08-17 DIAGNOSIS — Z5111 Encounter for antineoplastic chemotherapy: Secondary | ICD-10-CM | POA: Diagnosis not present

## 2015-08-17 DIAGNOSIS — C787 Secondary malignant neoplasm of liver and intrahepatic bile duct: Secondary | ICD-10-CM

## 2015-08-17 DIAGNOSIS — C3491 Malignant neoplasm of unspecified part of right bronchus or lung: Secondary | ICD-10-CM

## 2015-08-17 DIAGNOSIS — C414 Malignant neoplasm of pelvic bones, sacrum and coccyx: Secondary | ICD-10-CM

## 2015-08-17 LAB — CEA: CEA: 371.6 ng/mL — ABNORMAL HIGH (ref 0.0–4.7)

## 2015-08-17 MED ORDER — HEPARIN SOD (PORK) LOCK FLUSH 100 UNIT/ML IV SOLN
500.0000 [IU] | Freq: Once | INTRAVENOUS | Status: AC | PRN
  Administered 2015-08-17: 500 [IU]
  Filled 2015-08-17: qty 5

## 2015-08-17 MED ORDER — SODIUM CHLORIDE 0.9 % IV SOLN
10.0000 mg | Freq: Once | INTRAVENOUS | Status: AC
  Administered 2015-08-17: 10 mg via INTRAVENOUS
  Filled 2015-08-17: qty 1

## 2015-08-17 MED ORDER — SODIUM CHLORIDE 0.9 % IV SOLN
Freq: Once | INTRAVENOUS | Status: AC
  Administered 2015-08-17: 14:00:00 via INTRAVENOUS
  Filled 2015-08-17: qty 1000

## 2015-08-17 MED ORDER — SODIUM CHLORIDE 0.9% FLUSH
10.0000 mL | INTRAVENOUS | Status: DC | PRN
  Filled 2015-08-17: qty 10

## 2015-08-17 MED ORDER — OXYCODONE HCL 5 MG PO TABS
5.0000 mg | ORAL_TABLET | Freq: Once | ORAL | Status: AC
  Administered 2015-08-17: 5 mg via ORAL
  Filled 2015-08-17: qty 1

## 2015-08-17 MED ORDER — SODIUM CHLORIDE 0.9 % IV SOLN
100.0000 mg/m2 | Freq: Once | INTRAVENOUS | Status: AC
  Administered 2015-08-17: 220 mg via INTRAVENOUS
  Filled 2015-08-17: qty 11

## 2015-08-17 NOTE — Telephone Encounter (Signed)
Pt called with several questions. One - he wanted to know what ct said on 5/5 about his stomach-his stomach was normal. He ate 4 bowls of his homemade soup yest. And took more percocet because of his back and had some itching and also had BM and when he first was on toilet he pushed to get it started and it was firm but then got soft but when he wiped he saw a spot of blood  On toilet paper.  He also has a cough no sputum coughing up. No fever. Chest feels a little tight.  Told him if he sees blood again to call, and if he has production of colored sputum or fever to call back.  He then called back again to check his tumor marker and it has decreased from the last time.

## 2015-08-18 ENCOUNTER — Inpatient Hospital Stay: Payer: Medicaid Other

## 2015-08-18 ENCOUNTER — Other Ambulatory Visit: Payer: Self-pay | Admitting: Hematology and Oncology

## 2015-08-18 VITALS — BP 106/68 | HR 70 | Temp 98.4°F | Resp 20

## 2015-08-18 DIAGNOSIS — C3491 Malignant neoplasm of unspecified part of right bronchus or lung: Secondary | ICD-10-CM

## 2015-08-18 DIAGNOSIS — Z5111 Encounter for antineoplastic chemotherapy: Secondary | ICD-10-CM | POA: Diagnosis not present

## 2015-08-18 MED ORDER — SODIUM CHLORIDE 0.9% FLUSH
10.0000 mL | INTRAVENOUS | Status: DC | PRN
  Administered 2015-08-18: 10 mL
  Filled 2015-08-18: qty 10

## 2015-08-18 MED ORDER — SODIUM CHLORIDE 0.9 % IV SOLN
Freq: Once | INTRAVENOUS | Status: AC
Start: 2015-08-18 — End: 2015-08-18
  Administered 2015-08-18: 14:00:00 via INTRAVENOUS
  Filled 2015-08-18: qty 1000

## 2015-08-18 MED ORDER — HEPARIN SOD (PORK) LOCK FLUSH 100 UNIT/ML IV SOLN
500.0000 [IU] | Freq: Once | INTRAVENOUS | Status: AC | PRN
  Administered 2015-08-18: 500 [IU]
  Filled 2015-08-18: qty 5

## 2015-08-18 MED ORDER — PEGFILGRASTIM 6 MG/0.6ML ~~LOC~~ PSKT
6.0000 mg | PREFILLED_SYRINGE | Freq: Once | SUBCUTANEOUS | Status: AC
  Administered 2015-08-18: 6 mg via SUBCUTANEOUS
  Filled 2015-08-18: qty 0.6

## 2015-08-18 MED ORDER — SODIUM CHLORIDE 0.9 % IV SOLN
10.0000 mg | Freq: Once | INTRAVENOUS | Status: AC
  Administered 2015-08-18: 10 mg via INTRAVENOUS
  Filled 2015-08-18: qty 1

## 2015-08-18 MED ORDER — SODIUM CHLORIDE 0.9 % IV SOLN
100.0000 mg/m2 | Freq: Once | INTRAVENOUS | Status: AC
  Administered 2015-08-18: 220 mg via INTRAVENOUS
  Filled 2015-08-18: qty 11

## 2015-08-19 ENCOUNTER — Other Ambulatory Visit: Payer: Self-pay

## 2015-08-19 ENCOUNTER — Encounter: Payer: Self-pay | Admitting: *Deleted

## 2015-08-19 ENCOUNTER — Emergency Department
Admission: EM | Admit: 2015-08-19 | Discharge: 2015-08-19 | Disposition: A | Discharge status: Home / Self Care | Payer: Medicaid Other | Attending: Emergency Medicine | Admitting: Emergency Medicine

## 2015-08-19 ENCOUNTER — Inpatient Hospital Stay: Payer: Medicaid Other

## 2015-08-19 ENCOUNTER — Emergency Department: Payer: Medicaid Other

## 2015-08-19 DIAGNOSIS — C3490 Malignant neoplasm of unspecified part of unspecified bronchus or lung: Secondary | ICD-10-CM

## 2015-08-19 DIAGNOSIS — F17210 Nicotine dependence, cigarettes, uncomplicated: Secondary | ICD-10-CM | POA: Insufficient documentation

## 2015-08-19 DIAGNOSIS — Z79899 Other long term (current) drug therapy: Secondary | ICD-10-CM | POA: Insufficient documentation

## 2015-08-19 DIAGNOSIS — R062 Wheezing: Secondary | ICD-10-CM | POA: Diagnosis not present

## 2015-08-19 DIAGNOSIS — C787 Secondary malignant neoplasm of liver and intrahepatic bile duct: Secondary | ICD-10-CM | POA: Diagnosis not present

## 2015-08-19 DIAGNOSIS — I10 Essential (primary) hypertension: Secondary | ICD-10-CM | POA: Diagnosis not present

## 2015-08-19 DIAGNOSIS — C414 Malignant neoplasm of pelvic bones, sacrum and coccyx: Secondary | ICD-10-CM | POA: Insufficient documentation

## 2015-08-19 DIAGNOSIS — Z5111 Encounter for antineoplastic chemotherapy: Secondary | ICD-10-CM | POA: Diagnosis not present

## 2015-08-19 MED ORDER — PEGFILGRASTIM INJECTION 6 MG/0.6ML ~~LOC~~
6.0000 mg | PREFILLED_SYRINGE | Freq: Once | SUBCUTANEOUS | Status: DC
  Filled 2015-08-19: qty 0.6

## 2015-08-19 MED ORDER — PEGFILGRASTIM INJECTION 6 MG/0.6ML ~~LOC~~
6.0000 mg | PREFILLED_SYRINGE | Freq: Once | SUBCUTANEOUS | Status: AC
Start: 2015-08-19 — End: 2015-08-19
  Administered 2015-08-19: 6 mg via SUBCUTANEOUS
  Filled 2015-08-19: qty 0.6

## 2015-08-19 MED ORDER — ALBUTEROL SULFATE (2.5 MG/3ML) 0.083% IN NEBU
2.5000 mg | INHALATION_SOLUTION | Freq: Once | RESPIRATORY_TRACT | Status: AC
Start: 2015-08-19 — End: 2015-08-19
  Administered 2015-08-19: 2.5 mg via RESPIRATORY_TRACT
  Filled 2015-08-19: qty 3

## 2015-08-19 NOTE — ED Notes (Addendum)
Pt left without discharge instructions or paperwork. After pt left dr wrote the discharge paperwork. Dr aware.

## 2015-08-19 NOTE — ED Notes (Signed)
Pt states was sent over for cxr - not happy about being here. Believes that we missed diagnosing him with lung ca. Talks over top of this nurse and does not want

## 2015-08-19 NOTE — ED Notes (Signed)
States wheezing and SOB, states hx of lung ca, being currently tx by chem for the past 3 days, pt in no acute distress

## 2015-08-19 NOTE — ED Provider Notes (Signed)
[facility] Emergency Department Provider Note    ____________________________________________  Time seen: ~1710  I have reviewed the triage vital signs and the nursing notes.   HISTORY  Chief Complaint Wheezing and Shortness of Breath   History limited by: Not Limited   HPI Chris DEIS is a 55 y.o. male with history of stage IV lung cancer presents to the emergency department today at the request of his oncologist to obtain a chest x-ray. The patient states that he recently had 3 days of chemotherapy. He states that he started developing wheezing after the second day. He states this is happened to him before when he has had chemotherapy. He states when he discussed this with his oncologist today he stated that she wanted him to get a chest x-ray. The patient states that he was told to come to the emergency department. The patient states that when this happened previously he simply had an outpatient x-ray. Denies fevers.   Past Medical History  Diagnosis Date  . Hypertension   . Hyperthyroidism   . Cancer ([place])     right lung mass, left pubic bone lesion, hepatic mass    Patient Active Problem List   Diagnosis Date Noted  . Dental abscess 07/13/2015  . 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 Anxiety 06/11/2015  . Liver metastases ([place]) 06/09/2015  . Lung mass 06/04/2015  . 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 Elbow arthroplasty    . Peripheral vascular catheterization N/A 07/08/2015    Procedure: Glori Luis Cath Insertion;  Surgeon: Algernon Huxley, MD;  Location: [facility];  Service: Cardiovascular;  Laterality: N/A;    Current Outpatient Rx  Name  Route  Sig  Dispense  Refill  . lidocaine-prilocaine (EMLA) cream   Topical   Apply 1 application topically as needed. Apply 1/2 Tablespoon 1-2 hours before port use and cover.   30  g   0   . LORazepam (ATIVAN) 1 MG tablet      1/2 tablet to 1 tablet every 6 hours as needed for anxiety   40 tablet   0   . losartan-hydrochlorothiazide (HYZAAR) 50-12.5 MG per tablet   Oral   Take 1 tablet by mouth daily.         . magic mouthwash w/lidocaine SOLN   Oral   Take 10 mLs by mouth 4 (four) times daily as needed for mouth pain. Patient not taking: Reported on 08/16/2015   480 mL   3   . nicotine (NICODERM CQ) 21 mg/24hr patch   Transdermal   Place 1 patch (21 mg total) onto the skin daily.   28 patch   0   . Oxycodone HCl 10 MG TABS   Oral   Take 1 tablet (10 mg total) by mouth every 4 (four) hours as needed.   60 tablet   0     Allergies Review of patient's allergies indicates no known allergies.  Family History  Problem Relation Age of Onset  . Hypertension Mother   . Cancer Paternal Aunt     Breast     Social History Social History  Substance Use Topics  . Smoking status: Current Every Day Smoker -- 0.50 packs/day for 35 years    Types: Cigarettes  . Smokeless tobacco: None  . Alcohol Use: No    Review of Systems  Constitutional:  Negative for fever. Cardiovascular: Negative for chest pain. Respiratory: Negative for shortness of breath.Positive for wheezing. Gastrointestinal: Negative for abdominal pain, vomiting and diarrhea. Neurological: Negative for headaches, focal weakness or numbness.  10-point ROS otherwise negative.  ____________________________________________   PHYSICAL EXAM:  VITAL SIGNS: ED Triage Vitals  Enc Vitals Group     BP 08/19/15 1645 135/84 mmHg     Pulse Rate 08/19/15 1645 77     Resp 08/19/15 1645 18     Temp 08/19/15 1645 98.3 F (36.8 C)     Temp Source 08/19/15 1645 Oral     SpO2 08/19/15 1645 96 %     Weight 08/19/15 1645 207 lb (93.895 kg)     Height 08/19/15 1645 '5\' 11"'$  (1.803 m)     Head Cir --      Peak Flow --      Pain Score 08/19/15 1646 0   Constitutional: Alert and oriented. Well  appearing and in no distress. Eyes: Conjunctivae are normal. PERRL. Normal extraocular movements. ENT   Head: Normocephalic and atraumatic.   Nose: No congestion/rhinnorhea.   Mouth/Throat: Mucous membranes are moist.   Neck: No stridor. Hematological/Lymphatic/Immunilogical: No cervical lymphadenopathy. Cardiovascular: Normal rate, regular rhythm.  No murmurs, rubs, or gallops. Respiratory: Normal respiratory effort without tachypnea nor retractions. Breath sounds are clear and equal bilaterally. No wheezes/rales/rhonchi. Gastrointestinal: Soft and nontender. No distention. There is no CVA tenderness. Genitourinary: Deferred Musculoskeletal: Normal range of motion in all extremities. No joint effusions.  No lower extremity tenderness nor edema. Neurologic:  Normal speech and language. No gross focal neurologic deficits are appreciated.  Skin:  Skin is warm, dry and intact. No rash noted. Psychiatric: Mood and affect are normal. Speech and behavior are normal. Patient exhibits appropriate insight and judgment.  ____________________________________________    LABS (pertinent positives/negatives)  None ____________________________________________   EKG  I, Nance Pear, attending physician, personally viewed and interpreted this EKG  EKG Time: 1651 Rate: 77 Rhythm: normal sinus rhythm Axis: left axis deviation Intervals: qtc 441 QRS: incomplete RBBB ST changes: no st elevation Impression: abnormal ekg  ____________________________________________    RADIOLOGY  CXR IMPRESSION: 1. No acute cardiopulmonary abnormalities.  ____________________________________________   PROCEDURES  Procedure(s) performed: None  Critical Care performed: No  ____________________________________________   INITIAL IMPRESSION / ASSESSMENT AND PLAN / ED COURSE  Pertinent labs & imaging results that were available during my care of the patient were reviewed by me and  considered in my medical decision making (see chart for details).  Patient presents to the emergency department today because of the request of the oncologist that he undergo a chest x-ray. The patient did voice some frustration at home along to. I did discuss with the patient that given that an outpatient x-ray was not ordered and that he was sent to the emergency department it is likely that the oncologist wanted further workup. He however declined any blood work this time.  ----------------------------------------- 6:26 PM on 08/19/2015 -----------------------------------------  Chest x-ray without any concerning findings. The number that was left for Dr. Mike Gip was not working. I did discuss with the patient that we would page her. He stated that he would rather leave and then just have her call him directly. The patient left before paperwork could be prepared.  ____________________________________________   FINAL CLINICAL IMPRESSION(S) / ED DIAGNOSES  Final diagnoses:  Wheezing     Nance Pear, MD 08/19/15 1827

## 2015-08-22 ENCOUNTER — Encounter: Payer: Self-pay | Admitting: Hematology and Oncology

## 2015-08-24 ENCOUNTER — Telehealth: Payer: Self-pay | Admitting: *Deleted

## 2015-08-24 NOTE — Telephone Encounter (Signed)
Pt called and needs refill of lorazepam and it was called into the [facility].called pt and let him know the drug refill called in.

## 2015-08-26 ENCOUNTER — Telehealth: Payer: Self-pay | Admitting: *Deleted

## 2015-08-26 DIAGNOSIS — C787 Secondary malignant neoplasm of liver and intrahepatic bile duct: Secondary | ICD-10-CM

## 2015-08-26 DIAGNOSIS — C3491 Malignant neoplasm of unspecified part of right bronchus or lung: Secondary | ICD-10-CM

## 2015-08-26 DIAGNOSIS — C414 Malignant neoplasm of pelvic bones, sacrum and coccyx: Secondary | ICD-10-CM

## 2015-08-26 MED ORDER — OXYCODONE HCL 10 MG PO TABS: 10.0000 mg | ORAL_TABLET | ORAL | Status: DC | PRN

## 2015-08-26 NOTE — Telephone Encounter (Signed)
Pt goin out of town today and he is already coming over here to see Barnabas Lister and would like a refill of pain meds.  He takes 4-5 pills a day. He wants to come pick it up today.  Called him back and told him he can come pick it up.

## 2015-08-31 ENCOUNTER — Telehealth: Payer: Self-pay | Admitting: *Deleted

## 2015-08-31 NOTE — Telephone Encounter (Signed)
Pt felt like he had tooth infection where his wisdom tooth was pulled. Mouth was swollen.  It got to feeling better but had already made an appt with dentist.  He looked in his mouth and said he saw no signs of infection but because jaw was swollen some he gave him rx amoxicillin.  Then pt asked for xray of mouth and it showed no infection.  He still having sinus drainage and thought he should go ahead and take atb.  He has no fever and he is not coughing up any yellow sputum. Spoke to [place] and she was ok with the atb since jaw still swollen and he is have sinus issues.  Otherwise pt feels good today. No pain.  He is using a tarbar filter when he smokes his cigarettes.  Is it used to block tar and nicotine from cigarette.  He did mention that he feels a little bleb at his anus.  I asked him if it was a hemorrhoid and he states he does not know what one looks like. He will get it checked out when he comes on next visit.

## 2015-09-03 ENCOUNTER — Other Ambulatory Visit: Payer: Self-pay

## 2015-09-03 DIAGNOSIS — C3491 Malignant neoplasm of unspecified part of right bronchus or lung: Secondary | ICD-10-CM

## 2015-09-06 ENCOUNTER — Other Ambulatory Visit: Payer: Self-pay | Admitting: Hematology and Oncology

## 2015-09-06 ENCOUNTER — Inpatient Hospital Stay (HOSPITAL_BASED_OUTPATIENT_CLINIC_OR_DEPARTMENT_OTHER): Payer: Medicaid Other | Admitting: Hematology and Oncology

## 2015-09-06 ENCOUNTER — Inpatient Hospital Stay: Payer: Medicaid Other

## 2015-09-06 ENCOUNTER — Encounter: Payer: Self-pay | Admitting: Hematology and Oncology

## 2015-09-06 ENCOUNTER — Inpatient Hospital Stay: Payer: Medicaid Other | Attending: Hematology and Oncology

## 2015-09-06 VITALS — BP 142/91 | HR 66 | Temp 97.1°F | Resp 18 | Wt 214.9 lb

## 2015-09-06 DIAGNOSIS — K649 Unspecified hemorrhoids: Secondary | ICD-10-CM | POA: Diagnosis not present

## 2015-09-06 DIAGNOSIS — C7951 Secondary malignant neoplasm of bone: Secondary | ICD-10-CM | POA: Insufficient documentation

## 2015-09-06 DIAGNOSIS — Z7689 Persons encountering health services in other specified circumstances: Secondary | ICD-10-CM | POA: Diagnosis not present

## 2015-09-06 DIAGNOSIS — Z5111 Encounter for antineoplastic chemotherapy: Secondary | ICD-10-CM | POA: Insufficient documentation

## 2015-09-06 DIAGNOSIS — E0590 Thyrotoxicosis, unspecified without thyrotoxic crisis or storm: Secondary | ICD-10-CM

## 2015-09-06 DIAGNOSIS — F17210 Nicotine dependence, cigarettes, uncomplicated: Secondary | ICD-10-CM

## 2015-09-06 DIAGNOSIS — C787 Secondary malignant neoplasm of liver and intrahepatic bile duct: Secondary | ICD-10-CM | POA: Diagnosis not present

## 2015-09-06 DIAGNOSIS — I10 Essential (primary) hypertension: Secondary | ICD-10-CM

## 2015-09-06 DIAGNOSIS — C3411 Malignant neoplasm of upper lobe, right bronchus or lung: Secondary | ICD-10-CM | POA: Diagnosis not present

## 2015-09-06 DIAGNOSIS — C3491 Malignant neoplasm of unspecified part of right bronchus or lung: Secondary | ICD-10-CM

## 2015-09-06 DIAGNOSIS — Z803 Family history of malignant neoplasm of breast: Secondary | ICD-10-CM

## 2015-09-06 DIAGNOSIS — Z79899 Other long term (current) drug therapy: Secondary | ICD-10-CM

## 2015-09-06 DIAGNOSIS — R970 Elevated carcinoembryonic antigen [CEA]: Secondary | ICD-10-CM | POA: Insufficient documentation

## 2015-09-06 DIAGNOSIS — C414 Malignant neoplasm of pelvic bones, sacrum and coccyx: Secondary | ICD-10-CM

## 2015-09-06 LAB — COMPREHENSIVE METABOLIC PANEL
ALT: 25 U/L (ref 17–63)
AST: 18 U/L (ref 15–41)
Albumin: 3.9 g/dL (ref 3.5–5.0)
Alkaline Phosphatase: 84 U/L (ref 38–126)
Anion gap: 8 (ref 5–15)
BUN: 22 mg/dL — ABNORMAL HIGH (ref 6–20)
CO2: 25 mmol/L (ref 22–32)
Calcium: 9.6 mg/dL (ref 8.9–10.3)
Chloride: 105 mmol/L (ref 101–111)
Creatinine, Ser: 0.8 mg/dL (ref 0.61–1.24)
GFR calc Af Amer: >60 mL/min (ref >60)
GFR calc non Af Amer: >60 mL/min (ref >60)
Glucose, Bld: 156 mg/dL — ABNORMAL HIGH (ref 65–99)
Potassium: 4.1 mmol/L (ref 3.5–5.1)
Sodium: 138 mmol/L (ref 135–145)
Total Bilirubin: 0.3 mg/dL (ref 0.3–1.2)
Total Protein: 7.3 g/dL (ref 6.5–8.1)

## 2015-09-06 LAB — CBC WITH DIFFERENTIAL/PLATELET
Basophils Absolute: 0.1 10*3/uL (ref 0–0.1)
Basophils Relative: 1 %
Eosinophils Absolute: 0.2 10*3/uL (ref 0–0.7)
Eosinophils Relative: 1 %
HCT: 39.9 % — ABNORMAL LOW (ref 40.0–52.0)
Hemoglobin: 14.1 g/dL (ref 13.0–18.0)
Lymphocytes Relative: 25 %
Lymphs Abs: 3 10*3/uL (ref 1.0–3.6)
MCH: 30.8 pg (ref 26.0–34.0)
MCHC: 35.2 g/dL (ref 32.0–36.0)
MCV: 87.5 fL (ref 80.0–100.0)
Monocytes Absolute: 1.4 10*3/uL — ABNORMAL HIGH (ref 0.2–1.0)
Monocytes Relative: 11 %
Neutro Abs: 7.7 10*3/uL — ABNORMAL HIGH (ref 1.4–6.5)
Neutrophils Relative %: 62 %
Platelets: 275 10*3/uL (ref 150–440)
RBC: 4.56 MIL/uL (ref 4.40–5.90)
RDW: 15.1 % — ABNORMAL HIGH (ref 11.5–14.5)
WBC: 12.3 10*3/uL — ABNORMAL HIGH (ref 3.8–10.6)

## 2015-09-06 LAB — MAGNESIUM: Magnesium: 1.8 mg/dL (ref 1.7–2.4)

## 2015-09-06 MED ORDER — CARBOPLATIN CHEMO INJECTION 600 MG/60ML
750.0000 mg | Freq: Once | INTRAVENOUS | Status: AC
  Administered 2015-09-06: 750 mg via INTRAVENOUS
  Filled 2015-09-06: qty 75

## 2015-09-06 MED ORDER — HEPARIN SOD (PORK) LOCK FLUSH 100 UNIT/ML IV SOLN
500.0000 [IU] | Freq: Once | INTRAVENOUS | Status: AC | PRN
  Administered 2015-09-06: 500 [IU]
  Filled 2015-09-06: qty 5

## 2015-09-06 MED ORDER — SODIUM CHLORIDE 0.9 % IV SOLN
10.0000 mg | Freq: Once | INTRAVENOUS | Status: AC
  Administered 2015-09-06: 10 mg via INTRAVENOUS
  Filled 2015-09-06: qty 1

## 2015-09-06 MED ORDER — SODIUM CHLORIDE 0.9% FLUSH
10.0000 mL | INTRAVENOUS | Status: DC | PRN
  Administered 2015-09-06: 10 mL
  Filled 2015-09-06: qty 10

## 2015-09-06 MED ORDER — SODIUM CHLORIDE 0.9 % IV SOLN
Freq: Once | INTRAVENOUS | Status: AC
  Administered 2015-09-06: 10:00:00 via INTRAVENOUS
  Filled 2015-09-06: qty 1000

## 2015-09-06 MED ORDER — SODIUM CHLORIDE 0.9 % IV SOLN
100.0000 mg/m2 | Freq: Once | INTRAVENOUS | Status: AC
  Administered 2015-09-06: 220 mg via INTRAVENOUS
  Filled 2015-09-06: qty 11

## 2015-09-06 MED ORDER — PALONOSETRON HCL INJECTION 0.25 MG/5ML
0.2500 mg | Freq: Once | INTRAVENOUS | Status: AC
  Administered 2015-09-06: 0.25 mg via INTRAVENOUS
  Filled 2015-09-06: qty 5

## 2015-09-06 NOTE — Progress Notes (Signed)
Pt is having dizziness/lightheadedness the past few days while outside, states has not been drinking enough water throughout the day. Is having occasional dull headaches the started about 2 weeks. Has not started the nicotine patches due to the pharmacy having to order them. Has switched cigarettes to ultra-light and started smoking with "tar-bar."

## 2015-09-06 NOTE — Progress Notes (Signed)
[facility] day:  09/06/2015   Chief Complaint: Chris Mason is a 55 y.o. male with extensive stage small cell lung cancer who is seen for assessment prior to cycle #4 carboplatin and etoposide.  HPI:   The patient was last seen by in the medical oncology clinic on 08/16/2015.  At that time, he was seen prior to cycle #3 carboplatin and etoposide.  Symptomatically, he had some left rib and leg discomfort.  Exam was stable.  CEA was 371.6 (improved).  He received his chemotherapy uneventfully.  He received Neulasta support on 08/19/2015.  At that time, he also received albuterol because of wheezing noted prior to administration of Neulasta.  He was referred to the ER with persistent wheezing and shortness of breath.  CXR revealed no acute cardiopulmonary abnormalities.  During the interim, he notes right jaw pain at prior site of extraction.  He saw his dentist who prescribed amoxicillin (completed yesterday).  He states that an x-ray of his jaw was good.  He notes some hemorrhoidal issues which have resolved.  He comments that he had some pimples on his face and in his scalp after chemotherapy.  He felt light headed after washing his car outside in the sun.  He has had no further instances of dizziness inside doing "normal activities".  He feels that his pain starts to come back as time goes on after each chemotherapy treatment (better after treatment).  He is active.  He has not started the nicotine patch (not arrived from pharmacy).  He is smoking light cigarettes, but is cutting back using the "tar bar" to see what he is doing to his lungs.    Past Medical History  Diagnosis Date  . Hypertension   . Hyperthyroidism   . Cancer ([place])     right lung mass, left pubic bone lesion, hepatic mass    Past Surgical History  Procedure Laterality Date  . Elbow arthroplasty    . Peripheral vascular catheterization N/A 07/08/2015    Procedure: Glori Luis Cath  Insertion;  Surgeon: Algernon Huxley, MD;  Location: [facility];  Service: Cardiovascular;  Laterality: N/A;    Family History  Problem Relation Age of Onset  . Hypertension Mother   . Cancer Paternal Aunt     Breast     Social History:  reports that he has been smoking Cigarettes.  He has a 17.5 pack-year smoking history. He does not have any smokeless tobacco history on file. He reports that he does not drink alcohol or use illicit drugs.  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  The patient is alone today.  Allergies: No Known Allergies  Current Medications: Current Outpatient Prescriptions  Medication Sig Dispense Refill  . atorvastatin (LIPITOR) 20 MG tablet Take 20 mg by mouth daily.    Marland Kitchen lidocaine-prilocaine (EMLA) cream Apply 1 application topically as needed. Apply 1/2 Tablespoon 1-2 hours before port use and cover. 30 g 0  . LORazepam (ATIVAN) 1 MG tablet 1/2 tablet to 1 tablet every 6 hours as needed for anxiety 40 tablet 0  . losartan-hydrochlorothiazide (HYZAAR) 50-12.5 MG per tablet Take 1 tablet by mouth daily.    . magic mouthwash w/lidocaine SOLN Take 10 mLs by mouth 4 (four) times daily as needed for mouth pain. 480 mL 3  . Oxycodone HCl 10 MG TABS Take 1 tablet (10 mg total)  by mouth every 4 (four) hours as needed. 60 tablet 0  . nicotine (NICODERM CQ) 21 mg/24hr patch Place 1 patch (21 mg total) onto the skin daily. (Patient not taking: Reported on 09/06/2015) 28 patch 0   No current facility-administered medications for this visit.   Review of Systems:  GENERAL:  Feels good.  No fevers or sweats.  Weight up 2 pounds. PERFORMANCE STATUS (ECOG):  1 HEENT:  No visual changes, runny nose, sore throat, mouth sores or tenderness. Lungs:  No shortness of breath or cough.  No hemoptysis. Cardiac:  No chest pain, palpitations, orthopnea, or PND. GI:  No nausea, vomiting, diarrhea, constipation,  melena or hematochezia.  No prior colonoscopy.  Hemorrhoids GU:  No urgency, frequency, dysuria, or hematuria. Musculoskeletal:  Left hip pain and rib pain (waxes and wanes).  No muscle tenderness. Extremities:  No pain or swelling. Skin:  No rashes, skin changes, or ulcers. Neuro:  Dizziness after being in the hot sun.  No numbness or weakness, balance or coordination issues. Endocrine:  No diabetes, thyroid issues, hot flashes or night sweats. Psych:  Anxiety.  No mood changes or depression. Pain:  No focal pain. Review of systems:  All other systems reviewed and found to be negative.  Physical Exam: Blood pressure 142/91, pulse 66, temperature 97.1 F (36.2 C), temperature source Tympanic, resp. rate 18, weight 214 lb 15.2 oz (97.5 kg). GENERAL:  Well developed, well nourished, gentleman sitting comfortably in the exam room in no acute distress.  MENTAL STATUS:  Alert and oriented to person, place and time. HEAD:  Short brown hair thinning with graying goatee.  Normocephalic, atraumatic, face symmetric, no Cushingoid features. EYES:  Pupils equal round and reactive to light and accomodation.  No conjunctivitis or scleral icterus. ENT:  Oropharynx clear without lesion. s/p dental extractions (lower jaw bilaterally) healed.  Tongue normal.  Mucous membranes moist.  RESPIRATORY:  Clear to auscultation without rales, wheezes or rhonchi. CARDIOVASCULAR:  Regular rate and rhythm without murmur, rub or gallop. ABDOMEN:  Soft, non-tender, with active bowel sounds, and no hepatosplenomegaly.  No masses. SKIN:  No rashes, ulcers or lesions. EXTREMITIES: No edema, no skin discoloration or tenderness.  No palpable cords. LYMPH NODES: No palpable cervical, supraclavicular, axillary or inguinal adenopathy  NEUROLOGICAL: Unremarkable. PSYCH:  Appropriate.   Clinical Support on 09/06/2015  Component Date Value Ref Range Status  . WBC 09/06/2015 12.3* 3.8 - 10.6 K/uL Final  . RBC 09/06/2015 4.56   4.40 - 5.90 MIL/uL Final  . Hemoglobin 09/06/2015 14.1  13.0 - 18.0 g/dL Final  . HCT 09/06/2015 39.9* 40.0 - 52.0 % Final  . MCV 09/06/2015 87.5  80.0 - 100.0 fL Final  . MCH 09/06/2015 30.8  26.0 - 34.0 pg Final  . MCHC 09/06/2015 35.2  32.0 - 36.0 g/dL Final  . RDW 09/06/2015 15.1* 11.5 - 14.5 % Final  . Platelets 09/06/2015 275  150 - 440 K/uL Final  . Neutrophils Relative % 09/06/2015 62   Final  . Neutro Abs 09/06/2015 7.7* 1.4 - 6.5 K/uL Final  . Lymphocytes Relative 09/06/2015 25   Final  . Lymphs Abs 09/06/2015 3.0  1.0 - 3.6 K/uL Final  . Monocytes Relative 09/06/2015 11   Final  . Monocytes Absolute 09/06/2015 1.4* 0.2 - 1.0 K/uL Final  . Eosinophils Relative 09/06/2015 1   Final  . Eosinophils Absolute 09/06/2015 0.2  0 - 0.7 K/uL Final  . Basophils Relative 09/06/2015 1   Final  . Basophils  Absolute 09/06/2015 0.1  0 - 0.1 K/uL Final  . Sodium 09/06/2015 138  135 - 145 mmol/L Final  . Potassium 09/06/2015 4.1  3.5 - 5.1 mmol/L Final  . Chloride 09/06/2015 105  101 - 111 mmol/L Final  . CO2 09/06/2015 25  22 - 32 mmol/L Final  . Glucose, Bld 09/06/2015 156* 65 - 99 mg/dL Final  . BUN 09/06/2015 22* 6 - 20 mg/dL Final  . Creatinine, Ser 09/06/2015 0.80  0.61 - 1.24 mg/dL Final  . Calcium 09/06/2015 9.6  8.9 - 10.3 mg/dL Final  . Total Protein 09/06/2015 7.3  6.5 - 8.1 g/dL Final  . Albumin 09/06/2015 3.9  3.5 - 5.0 g/dL Final  . AST 09/06/2015 18  15 - 41 U/L Final  . ALT 09/06/2015 25  17 - 63 U/L Final  . Alkaline Phosphatase 09/06/2015 84  38 - 126 U/L Final  . Total Bilirubin 09/06/2015 0.3  0.3 - 1.2 mg/dL Final  . GFR calc non Af Amer 09/06/2015 >60  >60 mL/min Final  . GFR calc Af Amer 09/06/2015 >60  >60 mL/min Final   Comment: (NOTE) The eGFR has been calculated using the CKD EPI equation. This calculation has not been validated in all clinical situations. eGFR's persistently <60 mL/min signify possible Chronic Kidney Disease.   . Anion gap 09/06/2015 8  5  - 15 Final  . Magnesium 09/06/2015 1.8  1.7 - 2.4 mg/dL Final    Assessment:  DERYK BOZMAN is a 55 y.o. male with extensive stage small cell lung cancer.  He presented with a 2 month history of left hip pain.  CXR revealed a nodular density in the right upper lobe.  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    CT scan of the left hip on 06/04/2015 revealed a 3.3 x 2.5 cm destructive lesion in the left parasymphyseal pubic bone and superior pubic ramus.  Surrounding sift tissues demonstrated a low-attenuation soft tissue component of the lesion.  There was subtle lucency of the left greater trochanter and a small focus of cortical destruction along the anterior aspect of the greater trochanter.  There was also lucency in the periphery of the left femoral head without cortical disruption.  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 There was a dominant 2.0 x 1.8 cm right upper lobe pulmonary nodule (SUV 8.9) with thoracic nodal, hepatic, and multifocal osseous metastasis.  Examples within the left pubic bone at a SUV of 11.3, left transverse process at T7 (SUV of 8.9), C4 vertebral body (SUV of 5.7), lateral eighth left rib (SUV of 6.3.).  There was a 3.8 cm right hepatic lobe hypo-attenuating mass (SUV 8.4).    CEA was 912.6 on 06/23/2015, 429.8 on 07/13/2015, and 371.6 on 08/16/2015.  LDH was 205 on 06/23/2015 and 127 on 07/13/2015.  Head MRI on 06/16/2015 revealed no evidence for metastatic disease.   He receives [place] monthly (began 06/23/2015).  He underwent 2 dental extractions on 07/13/2015.  He is s/p 3 cycles of carboplatin and etoposide (06/22/2105 - 08/16/2015) with Neulasta support.   Chest, abdomen, and pelvic CT scan on 08/06/2015 suggested a mixed response to therapy. The primary bronchogenic carcinoma the right upper lobe was mildly decreased in size.  There  was a questionable new anterior medial subpleural soft tissue density in the right upper lobe.  There was stable right paratracheal adenopathy.  There was stable to slightly  improved appearance of multifocal liver metastasis.  There was stable appearance of lytic lesion involving the left pubic bone.  PET scan on 08/11/2015 revealed questionable slight progression compared to prior examinations with interval enlargement of pulmonary nodules in the right upper lobe, and slight interval growth of the large metastatic lesion within the right lobe of the liver (difficult to assess on PET). Other metastatic lesions to the bones appear very similar.  Review of imaging with radiology revealed no clear progression.  PET scan suggested that his port-a-cath line was coiled in his neck.  CXR revealed adequate port placement.  In addition, there was symmetric soft tissue thickening and hypermetabolism in the antrum of the stomach.  He recently started Nexium.  Symptomatically, he had some interval right lower jaw pain.  He had some transient dizziness after washing his car in the hot sun.  Exam is stable.  He trying to stop smoking.  Plan: 1.  Labs today:  CBC with diff, CMP, CEA, Mg.   2.  Begin cycle #4 carboplatin and etoposide (day 1-3) with On-Pro Neulasta on 09/08/2015. 3.  Hold Xgeva due to interval dental issues. 4.  Schedule chest, abdomen, and pelvic CT scan in 2 1/2 weeks. 5.  RTC in 3 weeks for MD assessment, labs (CBC with diff, CMP, Mg, CEA), review of CT scans and +/- cycle #5 carboplatin and etoposide.   Lequita Asal, MD  09/06/2015, 4:50 PM

## 2015-09-07 ENCOUNTER — Inpatient Hospital Stay: Payer: Medicaid Other

## 2015-09-07 ENCOUNTER — Telehealth: Payer: Self-pay | Admitting: *Deleted

## 2015-09-07 VITALS — BP 123/77 | HR 70 | Resp 20

## 2015-09-07 DIAGNOSIS — C3491 Malignant neoplasm of unspecified part of right bronchus or lung: Secondary | ICD-10-CM

## 2015-09-07 DIAGNOSIS — Z5111 Encounter for antineoplastic chemotherapy: Secondary | ICD-10-CM | POA: Diagnosis not present

## 2015-09-07 LAB — CEA: CEA: 376.5 ng/mL — ABNORMAL HIGH (ref 0.0–4.7)

## 2015-09-07 MED ORDER — HEPARIN SOD (PORK) LOCK FLUSH 100 UNIT/ML IV SOLN
500.0000 [IU] | Freq: Once | INTRAVENOUS | Status: AC | PRN
  Administered 2015-09-07: 500 [IU]
  Filled 2015-09-07: qty 5

## 2015-09-07 MED ORDER — SODIUM CHLORIDE 0.9 % IV SOLN
100.0000 mg/m2 | Freq: Once | INTRAVENOUS | Status: AC
  Administered 2015-09-07: 220 mg via INTRAVENOUS
  Filled 2015-09-07: qty 11

## 2015-09-07 MED ORDER — SODIUM CHLORIDE 0.9 % IV SOLN
10.0000 mg | Freq: Once | INTRAVENOUS | Status: AC
  Administered 2015-09-07: 10 mg via INTRAVENOUS
  Filled 2015-09-07: qty 1

## 2015-09-07 MED ORDER — SODIUM CHLORIDE 0.9 % IV SOLN
Freq: Once | INTRAVENOUS | Status: AC
  Administered 2015-09-07: 14:00:00 via INTRAVENOUS
  Filled 2015-09-07: qty 1000

## 2015-09-07 MED ORDER — OXYCODONE HCL 10 MG PO TABS: 10.0000 mg | ORAL_TABLET | ORAL | Status: DC | PRN

## 2015-09-07 NOTE — Telephone Encounter (Signed)
Pt called to get his cea levels and it was 5 points higher than last time. He states that last night he had chest tightness and pressure, at throat level and this morning he feels fine.  He would like to get a pulmonary appt with md since he keeps having breathing issues.  I will ask md and let him know. He also needs refil lof pain meds when he comes today.

## 2015-09-07 NOTE — Telephone Encounter (Signed)
Appointment notification

## 2015-09-08 ENCOUNTER — Encounter (INDEPENDENT_AMBULATORY_CARE_PROVIDER_SITE_OTHER): Payer: Self-pay

## 2015-09-08 ENCOUNTER — Encounter: Payer: Self-pay | Admitting: Internal Medicine

## 2015-09-08 ENCOUNTER — Inpatient Hospital Stay: Payer: Medicaid Other

## 2015-09-08 ENCOUNTER — Ambulatory Visit (INDEPENDENT_AMBULATORY_CARE_PROVIDER_SITE_OTHER): Payer: Medicaid Other | Admitting: Internal Medicine

## 2015-09-08 ENCOUNTER — Other Ambulatory Visit: Payer: Self-pay | Admitting: Hematology and Oncology

## 2015-09-08 VITALS — BP 144/90 | HR 75 | Ht 71.0 in | Wt 214.0 lb

## 2015-09-08 VITALS — BP 131/87 | HR 62 | Resp 20

## 2015-09-08 DIAGNOSIS — C3491 Malignant neoplasm of unspecified part of right bronchus or lung: Secondary | ICD-10-CM

## 2015-09-08 DIAGNOSIS — R0602 Shortness of breath: Secondary | ICD-10-CM

## 2015-09-08 DIAGNOSIS — Z5111 Encounter for antineoplastic chemotherapy: Secondary | ICD-10-CM | POA: Diagnosis not present

## 2015-09-08 MED ORDER — SODIUM CHLORIDE 0.9 % IV SOLN
10.0000 mg | Freq: Once | INTRAVENOUS | Status: AC
  Administered 2015-09-08: 10 mg via INTRAVENOUS
  Filled 2015-09-08: qty 1

## 2015-09-08 MED ORDER — HEPARIN SOD (PORK) LOCK FLUSH 100 UNIT/ML IV SOLN
500.0000 [IU] | Freq: Once | INTRAVENOUS | Status: AC
  Administered 2015-09-08: 500 [IU] via INTRAVENOUS

## 2015-09-08 MED ORDER — SODIUM CHLORIDE 0.9% FLUSH
10.0000 mL | INTRAVENOUS | Status: DC | PRN
  Filled 2015-09-08: qty 10

## 2015-09-08 MED ORDER — SODIUM CHLORIDE 0.9 % IV SOLN
100.0000 mg/m2 | Freq: Once | INTRAVENOUS | Status: AC
  Administered 2015-09-08: 220 mg via INTRAVENOUS
  Filled 2015-09-08: qty 11

## 2015-09-08 MED ORDER — CALCIUM CARBONATE 1250 (500 CA) MG PO CHEW
2500.0000 mg | CHEWABLE_TABLET | Freq: Once | ORAL | Status: AC
  Administered 2015-09-08: 2500 mg via ORAL
  Filled 2015-09-08: qty 2

## 2015-09-08 MED ORDER — SODIUM CHLORIDE 0.9 % IV SOLN
Freq: Once | INTRAVENOUS | Status: AC
  Administered 2015-09-08: 14:00:00 via INTRAVENOUS
  Filled 2015-09-08: qty 1000

## 2015-09-08 MED ORDER — UMECLIDINIUM-VILANTEROL 62.5-25 MCG/INH IN AEPB: 1.0000 | INHALATION_SPRAY | Freq: Every day | RESPIRATORY_TRACT | Status: AC

## 2015-09-08 MED ORDER — PEGFILGRASTIM 6 MG/0.6ML ~~LOC~~ PSKT
6.0000 mg | PREFILLED_SYRINGE | Freq: Once | SUBCUTANEOUS | Status: AC
  Administered 2015-09-08: 6 mg via SUBCUTANEOUS
  Filled 2015-09-08: qty 0.6

## 2015-09-08 MED ORDER — HEPARIN SOD (PORK) LOCK FLUSH 100 UNIT/ML IV SOLN
INTRAVENOUS | Status: AC
  Filled 2015-09-08: qty 5

## 2015-09-08 NOTE — Progress Notes (Signed)
Patient ID: Chris Mason, male   DOB: Aug 08, 1960, 55 y.o.   MRN: [id number] Patient seen in the office today and instructed on use of anoro ellipta.  Patient expressed understanding and demonstrated technique.

## 2015-09-08 NOTE — Patient Instructions (Addendum)
--  Will check full pulmonary function test. --Start the patches 1 weeks before quit date, it is ok to use the patches and smoke for a limited amount of time.   --CXR.  --Will start Anoro inhaler once daily.

## 2015-09-08 NOTE — Progress Notes (Signed)
[facility] Consultation      Assessment and Plan:  Acute respiratory failure. -Patient has forced expiratory wheezing, likely consistent with chronic bronchitis. We discussed that this is likely due to smoking status, we will start Anoro inhaler. -We discussed that his chronic bronchitis symptoms may persist even after he stops smoking. -We discussed to several requests. In regards to making sure that his lung function "monitored", as well as having the test performed that his brother had, which monitored his "carbon dioxide levels." We will be checking a pulmonary function test, as well as a chest x-ray to evaluate his progress.  Small cell lung cancer-extensive stage. -Continue to follow up with the cancer center.  Nicotine abuse. -Had a long discussion with regards to smoking cessation. He has a [place] that is due later this month, we discussed setting this is a quit date, he will start nicotine patches, week before. -We discussed that he can use nicotine patches and smoke at same time, however, his goal should be to quit smoking, not simply to wear the patches and hope that he stops smoking while wearing them..Should he restart smoking, he should stop the patches, re-set a quit date, and start the process anew.  Date: 09/08/2015  MRN# [id number] Chris Mason 10/19/1960  Referring Physician: Self referred.   Chris Mason is a 54 y.o. old male seen in consultation for chief complaint of:    Chief Complaint  Patient presents with  . Advice Only    self referral; small cell lung cancer; needs help to quit smoking;SOB comes and goes; cough prod at times; LT side ribs hurt when coughing and after chemo    HPI:   Chris Mason is a 55 y.o. Male smoker with extensive stage small cell lung cancer. His problems began around 6 to 8 months ago when he has been having throat pain and hoarseness. He has complaints about the way his problems were diagnosed,  complaints that were not addressed.  He was diagnosed after he was found to have leg pain and was found to have metastatic lung cancer.  Currently he notes that he is getting chemotherapy, he feels that his lung function issues have not been adequately addressed. He would like to make sure that his lungs are healthy enough and wants to know that his lungs are working well.  He has disease in his left 8th rib and has been having pain there particularly when he coughs.   Currently he feels that he does not have a problem with his breathing, but he does note that he has some wheezing. He is currently smoking 1 ppd.   Review of images, CT chest 08/06/15 and CXR 08/19/15 show RML scarring/nodules, there is right hilar, right paratracheal lymphadenopathy. Compression of the chest x-ray to previous chest x-ray shows slightly improved right middle lobe scar/nodule.   PMHX:   Past Medical History  Diagnosis Date  . Hypertension   . Hyperthyroidism   . Cancer ([place])     right lung mass, left pubic bone lesion, hepatic mass   Surgical Hx:  Past Surgical History  Procedure Laterality Date  . Elbow arthroplasty    . Peripheral vascular catheterization N/A 07/08/2015    Procedure: Glori Luis Cath Insertion;  Surgeon: Algernon Huxley, MD;  Location: [facility];  Service: Cardiovascular;  Laterality: N/A;   Family Hx:  Family History  Problem Relation Age of Onset  . Hypertension Mother   . Cancer Paternal Aunt  Breast    Social Hx:   Social History  Substance Use Topics  . Smoking status: Current Every Day Smoker -- 0.50 packs/day for 35 years    Types: Cigarettes  . Smokeless tobacco: Not on file  . Alcohol Use: No   Medication:   Current Outpatient Rx  Name  Route  Sig  Dispense  Refill  . atorvastatin (LIPITOR) 20 MG tablet   Oral   Take 20 mg by mouth daily.         Marland Kitchen lidocaine-prilocaine (EMLA) cream   Topical   Apply 1 application topically as needed. Apply 1/2 Tablespoon 1-2  hours before port use and cover.   30 g   0   . LORazepam (ATIVAN) 1 MG tablet      1/2 tablet to 1 tablet every 6 hours as needed for anxiety   40 tablet   0   . losartan-hydrochlorothiazide (HYZAAR) 50-12.5 MG per tablet   Oral   Take 1 tablet by mouth daily.         . magic mouthwash w/lidocaine SOLN   Oral   Take 10 mLs by mouth 4 (four) times daily as needed for mouth pain.   480 mL   3   . nicotine (NICODERM CQ) 21 mg/24hr patch   Transdermal   Place 1 patch (21 mg total) onto the skin daily. Patient not taking: Reported on 09/06/2015   28 patch   0   . Oxycodone HCl 10 MG TABS   Oral   Take 1 tablet (10 mg total) by mouth every 4 (four) hours as needed.   60 tablet   0       Allergies:  Review of patient's allergies indicates no known allergies.  Review of Systems: Gen:  Denies  fever, sweats, chills HEENT: Denies blurred vision, double vision. bleeds, sore throat Cvc:  No dizziness, chest pain. Resp:   Denies cough or sputum production, shortness of breath Gi: Denies swallowing difficulty, stomach pain. Gu:  Denies bladder incontinence, burning urine Ext:   No Joint pain, stiffness. Skin: No skin rash,  hives  Endoc:  No polyuria, polydipsia. Psych: No depression, insomnia. Other:  All other systems were reviewed with the patient and were negative other that what is mentioned in the HPI.   Physical Examination:   VS: BP 144/90 mmHg  Pulse 75  Ht '5\' 11"'$  (1.803 m)  Wt 214 lb (97.07 kg)  BMI 29.86 kg/m2  SpO2 97%  General Appearance: No distress  Neuro:without focal findings,  speech normal,  HEENT: PERRLA, EOM intact.   Pulmonary: normal breath sounds, No wheezing.  CardiovascularNormal S1,S2.  No m/r/g.   Abdomen: Benign, Soft, non-tender. Renal:  No costovertebral tenderness  GU:  No performed at this time. Endoc: No evident thyromegaly, no signs of acromegaly. Skin:   warm, no rashes, no ecchymosis  Extremities: normal, no cyanosis,  clubbing.  Other findings:    LABORATORY PANEL:   CBC  Recent Labs Lab 09/06/15 0813  WBC 12.3*  HGB 14.1  HCT 39.9*  PLT 275   ------------------------------------------------------------------------------------------------------------------  Chemistries   Recent Labs Lab 09/06/15 0813  NA 138  K 4.1  CL 105  CO2 25  GLUCOSE 156*  BUN 22*  CREATININE 0.80  CALCIUM 9.6  MG 1.8  AST 18  ALT 25  ALKPHOS 84  BILITOT 0.3   ------------------------------------------------------------------------------------------------------------------  Cardiac Enzymes No results for input(s): TROPONINI in the last 168 hours. ------------------------------------------------------------  RADIOLOGY:  No  results found.     Thank  you for the consultation and for allowing [facility] to assist in the care of your patient. Our recommendations are noted above.  Please contact us if we can be of further service.   Marda Stalker, MD.  Board Certified in Internal Medicine, Pulmonary Medicine, [place], and Sleep Medicine.  [facility] Office Number: [phone]  Patricia Pesa, M.D.  Vilinda Boehringer, M.D.  Merton Border, M.D  09/08/2015

## 2015-09-09 ENCOUNTER — Ambulatory Visit: Payer: Medicaid Other | Admitting: Dietician

## 2015-09-09 LAB — SURGICAL PATHOLOGY

## 2015-09-13 ENCOUNTER — Encounter: Payer: Self-pay | Admitting: Dietician

## 2015-09-13 ENCOUNTER — Encounter: Payer: Medicaid Other | Attending: Hematology and Oncology | Admitting: Dietician

## 2015-09-13 VITALS — Ht 70.5 in | Wt 212.4 lb

## 2015-09-13 DIAGNOSIS — C801 Malignant (primary) neoplasm, unspecified: Secondary | ICD-10-CM

## 2015-09-13 NOTE — Patient Instructions (Addendum)
Food group recommendations: 3 low fat milk products per day. 8 servings of fruits/vegetables per day. (Refer to list of fruits/vegetables based on nutrient content). At least 8 oz.of a protein food per day. (Refer to list) At least 3 servings of whole grain per day. (Brown rice, whole wheat pasta, whole grain cereals, 100% whole wheat)  Limit sugar sweetened tea. Include healthy forms of fat in nuts, seeds. Include plant sources as part of your protein intake. Limit Breyers ice cream to no more than 1 cup in evening.

## 2015-09-13 NOTE — Progress Notes (Signed)
Medical Nutrition Therapy: Visit start time: 5188  end time: 1645 Assessment:  Diagnosis: Lung cancer Past medical history: hypertension, hypercholesterolemia Psychosocial issues/ stress concerns: Patient reports he has moderate to high stress related to confusion and  frustration regarding cancer diagnosis and treatment. Preferred learning method:  . Auditory  Current weight: 212.4 lbs  Height: 70.5 in Medications, supplements: see list Progress and evaluation:  Patient in for initial medical nutrition therapy appointment. He reports he was diagnosed 06/11/15 with lung cancer and reports he also has cancer in his ribs and pubic bone. He also stated, "I have a spot on my liver". He is receiving chemo every 3 weeks for 3 weeks at a time. He reports he has gained 12 lbs since his diagnosis because he increased his calories concerned that he might lose weight with chemo treatments. He reports a good appetite other than several days after chemo when his taste is altered. He has increased his intake of fruits and vegetables and is limiting sweetened beverages. He reports blood sugars have been somewhat elevated due to steroids.  He includes 5-6 oz portions of meat 1-2 x/day;frequently chicken. He eats fruits and vegetables between meals and eats vegetables with most of his lunch and dinner meals. He is very motivated to eat a healthy diet. His present diet is adequate in protein, fruits/vegetables and is low in calcium sources and whole grains.   Dietary Intake:  Usual eating pattern includes 3 meals and 3-4 snacks per day. Breakfast: cereal with walnuts and bananas or raisin bran with added fruit or 1-2x/week- sausage/egg sandwich Lunch: white beans, hamburger steak, cucumbers/tomatoes or chicken and vegetables. Snack: low sodium canned tomatoes Supper: meat and 2 vegetables; patient does most of his own cooking Snack: 2 cups of Breyers ice cream with fruit added Beverages: water, Crystal lite with  Splenda, sweet tea  Nutrition Care Education: Basic nutrition: Instructed on food group servings needed to meet basic nutrient needs. Acknowledged that there is not a specific diet for lung cancer but gave and reviewed list of antioxidants and the foods that contain them that help with immune system as well are being studied in the role they play in cell health. Encouraged to limit concentrated sweets especially sweetened beverages if glucose continues to elevate. Discussed protein needs and discouraged from using protein powders as long as he is eating adequate protein sources. Instructed to wash all fruits and vegetables thoroughly.  Intervention:  Food group recommendations: 3 low fat milk products per day. 8 servings of fruits/vegetables per day. (Refer to list of fruits/vegetables based on nutrient content). At least 8 oz.of a protein food per day. (Refer to list) At least 3 servings of whole grain per day. (Brown rice, whole wheat pasta, whole grain cereals, 100% whole wheat)  Limit sugar sweetened tea. Include healthy forms of fat in nuts, seeds. Include plant sources as part of your protein intake. Limit Breyers ice cream to no more than 1 cup in evening.   Education Materials given:  . Food lists/ Planning A Balanced Meal . Anti-oxidant food listing . Protein Finder handout Learner/ who was taught:  . Patient  Level of understanding: Marland Kitchen Verbalizes understanding Learning barriers: . None Willingness to learn/ readiness for change: . Eager, change in progress  Monitoring and Evaluation:  No follow-up scheduled; encouraged patient to call if desires further help with his diet/nutrition.

## 2015-09-14 ENCOUNTER — Telehealth: Payer: Self-pay | Admitting: Internal Medicine

## 2015-09-14 MED ORDER — UMECLIDINIUM-VILANTEROL 62.5-25 MCG/INH IN AEPB: 1.0000 | INHALATION_SPRAY | Freq: Every day | RESPIRATORY_TRACT | Status: DC

## 2015-09-14 NOTE — Telephone Encounter (Signed)
Spoke with pt and Anoro sent to [facility]. Nothing further needed.

## 2015-09-14 NOTE — Telephone Encounter (Signed)
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  Please advise

## 2015-09-16 ENCOUNTER — Telehealth: Payer: Self-pay | Admitting: *Deleted

## 2015-09-16 NOTE — Telephone Encounter (Signed)
Pt needs information from when he was dx.and what treatment for pt.  I told him I would print off his pathology report and the note where md goes over his results and treatment plan for him. He wil lcome pick it up tom. And I have put it in envelope downstairs for him

## 2015-09-17 ENCOUNTER — Telehealth: Payer: Self-pay | Admitting: *Deleted

## 2015-09-17 ENCOUNTER — Other Ambulatory Visit: Payer: Self-pay | Admitting: *Deleted

## 2015-09-17 MED ORDER — OXYCODONE HCL 10 MG PO TABS: 10.0000 mg | ORAL_TABLET | ORAL | Status: DC | PRN

## 2015-09-17 NOTE — Telephone Encounter (Signed)
Anoro is not covered by Medicaid. Put must try and fail at least 2 of the following: Spiriva HH, Atrovent inhaler, Combivent Respimat, Atrovent Neb or Duoneb. Please advise on what you would like to have sent in place of Anoro. Thanks.

## 2015-09-17 NOTE — Telephone Encounter (Signed)
Please start spiriva handihaler once daily. May give samples.

## 2015-09-20 MED ORDER — TIOTROPIUM BROMIDE MONOHYDRATE 18 MCG IN CAPS: 18.0000 ug | ORAL_CAPSULE | Freq: Every day | RESPIRATORY_TRACT | Status: DC

## 2015-09-20 NOTE — Telephone Encounter (Signed)
LMOM for pt stating we have sent Spiriva HH to pharmacy since insurance will not cover Anoro. Nothing further needed.

## 2015-09-22 ENCOUNTER — Ambulatory Visit
Admission: RE | Admit: 2015-09-22 | Discharge: 2015-09-22 | Disposition: A | Discharge status: Home / Self Care | Payer: Medicaid Other | Source: Ambulatory Visit | Attending: Hematology and Oncology | Admitting: Hematology and Oncology

## 2015-09-22 DIAGNOSIS — C3491 Malignant neoplasm of unspecified part of right bronchus or lung: Secondary | ICD-10-CM | POA: Insufficient documentation

## 2015-09-22 DIAGNOSIS — I714 Abdominal aortic aneurysm, without rupture: Secondary | ICD-10-CM | POA: Insufficient documentation

## 2015-09-22 DIAGNOSIS — C771 Secondary and unspecified malignant neoplasm of intrathoracic lymph nodes: Secondary | ICD-10-CM | POA: Insufficient documentation

## 2015-09-22 DIAGNOSIS — I700 Atherosclerosis of aorta: Secondary | ICD-10-CM | POA: Diagnosis not present

## 2015-09-22 DIAGNOSIS — C414 Malignant neoplasm of pelvic bones, sacrum and coccyx: Secondary | ICD-10-CM

## 2015-09-22 DIAGNOSIS — C787 Secondary malignant neoplasm of liver and intrahepatic bile duct: Secondary | ICD-10-CM | POA: Diagnosis not present

## 2015-09-22 DIAGNOSIS — R918 Other nonspecific abnormal finding of lung field: Secondary | ICD-10-CM | POA: Diagnosis not present

## 2015-09-22 MED ORDER — IOPAMIDOL (ISOVUE-300) INJECTION 61%
100.0000 mL | Freq: Once | INTRAVENOUS | Status: AC | PRN
Start: 2015-09-22 — End: 2015-09-22
  Administered 2015-09-22: 100 mL via INTRAVENOUS

## 2015-09-26 ENCOUNTER — Other Ambulatory Visit: Payer: Self-pay | Admitting: Hematology and Oncology

## 2015-09-26 DIAGNOSIS — C3491 Malignant neoplasm of unspecified part of right bronchus or lung: Secondary | ICD-10-CM

## 2015-09-27 ENCOUNTER — Other Ambulatory Visit: Payer: Self-pay | Admitting: *Deleted

## 2015-09-27 ENCOUNTER — Telehealth: Payer: Self-pay | Admitting: *Deleted

## 2015-09-27 ENCOUNTER — Inpatient Hospital Stay: Payer: Medicaid Other

## 2015-09-27 ENCOUNTER — Ambulatory Visit (INDEPENDENT_AMBULATORY_CARE_PROVIDER_SITE_OTHER): Payer: Medicaid Other | Admitting: *Deleted

## 2015-09-27 ENCOUNTER — Inpatient Hospital Stay (HOSPITAL_BASED_OUTPATIENT_CLINIC_OR_DEPARTMENT_OTHER): Payer: Medicaid Other | Admitting: Hematology and Oncology

## 2015-09-27 ENCOUNTER — Encounter: Payer: Self-pay | Admitting: Hematology and Oncology

## 2015-09-27 VITALS — BP 138/85 | HR 77 | Temp 96.4°F | Resp 18 | Ht 70.5 in | Wt 216.2 lb

## 2015-09-27 DIAGNOSIS — C3491 Malignant neoplasm of unspecified part of right bronchus or lung: Secondary | ICD-10-CM | POA: Diagnosis not present

## 2015-09-27 DIAGNOSIS — C787 Secondary malignant neoplasm of liver and intrahepatic bile duct: Secondary | ICD-10-CM

## 2015-09-27 DIAGNOSIS — R0602 Shortness of breath: Secondary | ICD-10-CM

## 2015-09-27 DIAGNOSIS — C3411 Malignant neoplasm of upper lobe, right bronchus or lung: Secondary | ICD-10-CM

## 2015-09-27 DIAGNOSIS — C414 Malignant neoplasm of pelvic bones, sacrum and coccyx: Secondary | ICD-10-CM

## 2015-09-27 DIAGNOSIS — Z79899 Other long term (current) drug therapy: Secondary | ICD-10-CM

## 2015-09-27 DIAGNOSIS — K649 Unspecified hemorrhoids: Secondary | ICD-10-CM

## 2015-09-27 DIAGNOSIS — F17210 Nicotine dependence, cigarettes, uncomplicated: Secondary | ICD-10-CM

## 2015-09-27 DIAGNOSIS — I10 Essential (primary) hypertension: Secondary | ICD-10-CM

## 2015-09-27 DIAGNOSIS — Z7689 Persons encountering health services in other specified circumstances: Secondary | ICD-10-CM

## 2015-09-27 DIAGNOSIS — G893 Neoplasm related pain (acute) (chronic): Secondary | ICD-10-CM

## 2015-09-27 DIAGNOSIS — Z5111 Encounter for antineoplastic chemotherapy: Secondary | ICD-10-CM | POA: Diagnosis not present

## 2015-09-27 DIAGNOSIS — R970 Elevated carcinoembryonic antigen [CEA]: Secondary | ICD-10-CM

## 2015-09-27 DIAGNOSIS — E0590 Thyrotoxicosis, unspecified without thyrotoxic crisis or storm: Secondary | ICD-10-CM

## 2015-09-27 DIAGNOSIS — Z803 Family history of malignant neoplasm of breast: Secondary | ICD-10-CM

## 2015-09-27 DIAGNOSIS — C7951 Secondary malignant neoplasm of bone: Secondary | ICD-10-CM

## 2015-09-27 LAB — PULMONARY FUNCTION TEST
DL/VA % PRED: 89 %
DL/VA: 4.15 ml/min/mmHg/L
DLCO UNC: 33.13 ml/min/mmHg
DLCO unc % pred: 102 %
FEF 25-75 POST: 2.4 L/s
FEF 25-75 Pre: 2.01 L/sec
FEF2575-%Change-Post: 19 %
FEF2575-%PRED-POST: 72 %
FEF2575-%PRED-PRE: 60 %
FEV1-%Change-Post: 3 %
FEV1-%Pred-Post: 68 %
FEV1-%Pred-Pre: 66 %
FEV1-Post: 2.64 L
FEV1-Pre: 2.55 L
FEV1FVC-%Change-Post: 1 %
FEV1FVC-%PRED-PRE: 97 %
FEV6-%Change-Post: 2 %
FEV6-%Pred-Post: 71 %
FEV6-%Pred-Pre: 70 %
FEV6-POST: 3.44 L
FEV6-Pre: 3.36 L
FEV6FVC-%CHANGE-POST: 0 %
FEV6FVC-%PRED-POST: 104 %
FEV6FVC-%Pred-Pre: 103 %
FVC-%Change-Post: 1 %
FVC-%PRED-PRE: 68 %
FVC-%Pred-Post: 69 %
FVC-POST: 3.45 L
FVC-PRE: 3.39 L
POST FEV1/FVC RATIO: 77 %
PRE FEV1/FVC RATIO: 75 %
Post FEV6/FVC ratio: 100 %
Pre FEV6/FVC Ratio: 99 %
RV % pred: 104 %
RV: 2.2 L
TLC % PRED: 82 %
TLC: 5.74 L

## 2015-09-27 LAB — COMPREHENSIVE METABOLIC PANEL
ALT: 25 U/L (ref 17–63)
AST: 19 U/L (ref 15–41)
Albumin: 4 g/dL (ref 3.5–5.0)
Alkaline Phosphatase: 106 U/L (ref 38–126)
Anion gap: 7 (ref 5–15)
BUN: 20 mg/dL (ref 6–20)
CO2: 25 mmol/L (ref 22–32)
Calcium: 9.2 mg/dL (ref 8.9–10.3)
Chloride: 103 mmol/L (ref 101–111)
Creatinine, Ser: 0.8 mg/dL (ref 0.61–1.24)
GFR calc Af Amer: >60 mL/min (ref >60)
GFR calc non Af Amer: >60 mL/min (ref >60)
Glucose, Bld: 165 mg/dL — ABNORMAL HIGH (ref 65–99)
Potassium: 4 mmol/L (ref 3.5–5.1)
Sodium: 135 mmol/L (ref 135–145)
Total Bilirubin: 0.5 mg/dL (ref 0.3–1.2)
Total Protein: 8 g/dL (ref 6.5–8.1)

## 2015-09-27 LAB — MAGNESIUM: Magnesium: 2 mg/dL (ref 1.7–2.4)

## 2015-09-27 LAB — CBC WITH DIFFERENTIAL/PLATELET
Basophils Absolute: 0.1 10*3/uL (ref 0–0.1)
Basophils Relative: 1 %
Eosinophils Absolute: 0.1 10*3/uL (ref 0–0.7)
Eosinophils Relative: 1 %
HCT: 40.9 % (ref 40.0–52.0)
Hemoglobin: 14.4 g/dL (ref 13.0–18.0)
Lymphocytes Relative: 27 %
Lymphs Abs: 2.8 10*3/uL (ref 1.0–3.6)
MCH: 31.4 pg (ref 26.0–34.0)
MCHC: 35.1 g/dL (ref 32.0–36.0)
MCV: 89.4 fL (ref 80.0–100.0)
Monocytes Absolute: 1.3 10*3/uL — ABNORMAL HIGH (ref 0.2–1.0)
Monocytes Relative: 13 %
Neutro Abs: 6 10*3/uL (ref 1.4–6.5)
Neutrophils Relative %: 58 %
Platelets: 354 10*3/uL (ref 150–440)
RBC: 4.58 MIL/uL (ref 4.40–5.90)
RDW: 16.2 % — ABNORMAL HIGH (ref 11.5–14.5)
WBC: 10.3 10*3/uL (ref 3.8–10.6)

## 2015-09-27 MED ORDER — HEPARIN SOD (PORK) LOCK FLUSH 100 UNIT/ML IV SOLN
500.0000 [IU] | Freq: Once | INTRAVENOUS | Status: AC
  Administered 2015-09-27: 500 [IU] via INTRAVENOUS

## 2015-09-27 MED ORDER — HEPARIN SOD (PORK) LOCK FLUSH 100 UNIT/ML IV SOLN
INTRAVENOUS | Status: AC
  Filled 2015-09-27: qty 5

## 2015-09-27 MED ORDER — SODIUM CHLORIDE 0.9% FLUSH
10.0000 mL | Freq: Once | INTRAVENOUS | Status: AC
  Administered 2015-09-27: 10 mL via INTRAVENOUS
  Filled 2015-09-27: qty 10

## 2015-09-27 NOTE — Progress Notes (Signed)
[facility] day:  09/27/2015  Chief Complaint: Chris Mason is a 55 y.o. male with extensive stage small cell lung cancer who is seen for review of restaging studies after 4 cycles of carboplatin and etoposide.  HPI:   The patient was last seen by in the medical oncology clinic on 09/06/2015.  At that time, he received cycle #4 carboplatin and etoposide.  Symptomatically, he had some interval right lower jaw pain.  He described some transient dizziness after washing his car in the hot sun.  Exam was stable.  He was trying to stop smoking.  He received his chemotherapy with Neulasta support.  Chest, abdomen, and pelvic CT scan on 09/22/2015 revealed stable primary tumor in the basilar right upper lobe.  There was mild interval growth of separate basilar right upper lobe and two adjacent central right middle lobe pulmonary nodules worrisome for progressive pulmonary metastases.  There was stable right hilar and right paratracheal nodal metastases.  There was stable liver metastases.  There was increased sclerosis within the left T7 transverse process, left 8th rib and left pubic symphysis bone metastases.  Scans were reviewed with radiology.  Disease was overall stable to slightly improved with potential 2-4 mm growth in the RUL and RML nodules.  Symptomatically, he notes issues with a tooth (right lower jaw).  He states that the tooth is growing out of his gum.  Additional surgery is being considered ("shave off bone").  He notes that his rib quit hurting.  He continues to smoke.   Past Medical History  Diagnosis Date  . Hypertension   . Hyperthyroidism   . Cancer ([place])     right lung mass, left pubic bone lesion, hepatic mass    Past Surgical History  Procedure Laterality Date  . Elbow arthroplasty    . Peripheral vascular catheterization N/A 07/08/2015    Procedure: Glori Luis Cath Insertion;  Surgeon: Algernon Huxley, MD;  Location: [facility];  Service: Cardiovascular;  Laterality: N/A;    Family History  Problem Relation Age of Onset  . Hypertension Mother   . Cancer Paternal Aunt     Breast     Social History:  reports that he has been smoking Cigarettes.  He has a 35 pack-year smoking history. He does not have any smokeless tobacco history on file. He reports that he does not drink alcohol or use illicit drugs.  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  The patient is accompanied by his girlfriend today.  Allergies: No Known Allergies  Current Medications: Current Outpatient Prescriptions  Medication Sig Dispense Refill  . atorvastatin (LIPITOR) 20 MG tablet Take 20 mg by mouth daily.    Marland Kitchen lidocaine-prilocaine (EMLA) cream Apply 1 application topically as needed. Apply 1/2 Tablespoon 1-2 hours before port use and cover. 30 g 0  . LORazepam (ATIVAN) 1 MG tablet 1/2 tablet to 1 tablet every 6 hours as needed for anxiety 40 tablet 0  . losartan-hydrochlorothiazide (HYZAAR) 50-12.5 MG per tablet Take 1 tablet by mouth daily.    . Oxycodone HCl 10 MG TABS Take 1 tablet (10 mg total) by mouth every 4 (four) hours as needed. 60 tablet 0  . tiotropium (SPIRIVA HANDIHALER) 18 MCG inhalation capsule Place 1 capsule (18 mcg total) into inhaler and inhale daily. 30 capsule 6  . magic mouthwash w/lidocaine SOLN Take 10 mLs by  mouth 4 (four) times daily as needed for mouth pain. (Patient not taking: Reported on 09/27/2015) 480 mL 3  . umeclidinium-vilanterol (ANORO ELLIPTA) 62.5-25 MCG/INH AEPB Inhale 1 puff into the lungs daily. (Patient not taking: Reported on 09/27/2015) 60 each 5   No current facility-administered medications for this visit.   Review of Systems:  GENERAL:  Feels good.  No fevers or sweats.  Weight up 2 pounds. PERFORMANCE STATUS (ECOG):  1 HEENT:  Bone protruding from right lower jaw.  No visual changes, runny nose, sore throat, mouth sores or  tenderness. Lungs:  Shortness of breath on exertion.  Cough, improved.  No hemoptysis. Cardiac:  No chest pain, palpitations, orthopnea, or PND. GI:  No nausea, vomiting, diarrhea, constipation, melena or hematochezia.  No prior colonoscopy.  Hemorrhoids. GU:  No urgency, frequency, dysuria, or hematuria. Musculoskeletal:  Left rib pain, improved.  Jaw pain.  No muscle tenderness. Extremities:  No pain or swelling. Skin:  No rashes, skin changes, or ulcers. Neuro:  No headache, numbness or weakness, balance or coordination issues. Endocrine:  No diabetes, thyroid issues, hot flashes or night sweats. Psych:  Anxiety.  No mood changes or depression. Pain:  No focal pain. Review of systems:  All other systems reviewed and found to be negative.  Physical Exam: Blood pressure 138/85, pulse 77, temperature 96.4 F (35.8 C), temperature source Tympanic, resp. rate 18, height 5' 10.5" (1.791 m), weight 216 lb 2.6 oz (98.05 kg), SpO2 97 %. GENERAL:  Well developed, well nourished, gentleman sitting comfortably in the exam room in no acute distress.  MENTAL STATUS:  Alert and oriented to person, place and time. HEAD:  Short brown hair thinning with graying goatee.  Normocephalic, atraumatic, face symmetric, no Cushingoid features. EYES:  Pupils equal round and reactive to light and accomodation.  No conjunctivitis or scleral icterus. ENT:  Oropharynx clear without lesion. Right lower jaw with visible bone above gum line.  Tongue normal.  Mucous membranes moist.  RESPIRATORY:  Clear to auscultation without rales, wheezes or rhonchi. CARDIOVASCULAR:  Regular rate and rhythm without murmur, rub or gallop. ABDOMEN:  Soft, non-tender, with active bowel sounds, and no hepatosplenomegaly.  No masses. SKIN:  No rashes, ulcers or lesions. EXTREMITIES: No edema, no skin discoloration or tenderness.  No palpable cords. LYMPH NODES: No palpable cervical, supraclavicular, axillary or inguinal adenopathy   NEUROLOGICAL: Unremarkable. PSYCH:  Appropriate.   Clinical Support on 09/27/2015  Component Date Value Ref Range Status  . FVC-Pre 09/27/2015 3.39   Preliminary  . FVC-%Pred-Pre 09/27/2015 68   Preliminary  . FVC-Post 09/27/2015 3.45   Preliminary  . FVC-%Pred-Post 09/27/2015 69   Preliminary  . FVC-%Change-Post 09/27/2015 1   Preliminary  . FEV1-Pre 09/27/2015 2.55   Preliminary  . FEV1-%Pred-Pre 09/27/2015 66   Preliminary  . FEV1-Post 09/27/2015 2.64   Preliminary  . FEV1-%Pred-Post 09/27/2015 68   Preliminary  . FEV1-%Change-Post 09/27/2015 3   Preliminary  . FEV6-Pre 09/27/2015 3.36   Preliminary  . FEV6-%Pred-Pre 09/27/2015 70   Preliminary  . FEV6-Post 09/27/2015 3.44   Preliminary  . FEV6-%Pred-Post 09/27/2015 71   Preliminary  . FEV6-%Change-Post 09/27/2015 2   Preliminary  . Pre FEV1/FVC ratio 09/27/2015 75   Preliminary  . FEV1FVC-%Pred-Pre 09/27/2015 97   Preliminary  . Post FEV1/FVC ratio 09/27/2015 77   Preliminary  . FEV1FVC-%Change-Post 09/27/2015 1   Preliminary  . Pre FEV6/FVC Ratio 09/27/2015 99   Preliminary  . FEV6FVC-%Pred-Pre 09/27/2015 103   Preliminary  .  Post FEV6/FVC ratio 09/27/2015 100   Preliminary  . FEV6FVC-%Pred-Post 09/27/2015 104   Preliminary  . FEV6FVC-%Change-Post 09/27/2015 0   Preliminary  . FEF 25-75 Pre 09/27/2015 2.01   Preliminary  . FEF2575-%Pred-Pre 09/27/2015 60   Preliminary  . FEF 25-75 Post 09/27/2015 2.40   Preliminary  . FEF2575-%Pred-Post 09/27/2015 72   Preliminary  . FEF2575-%Change-Post 09/27/2015 19   Preliminary  . RV 09/27/2015 2.20   Preliminary  . RV % pred 09/27/2015 104   Preliminary  . TLC 09/27/2015 5.74   Preliminary  . TLC % pred 09/27/2015 82   Preliminary  . DLCO unc 09/27/2015 33.13   Preliminary  . DLCO unc % pred 09/27/2015 102   Preliminary  . DL/VA 09/27/2015 4.15   Preliminary  . DL/VA % pred 09/27/2015 89   Preliminary  Infusion on 09/27/2015  Component Date Value Ref Range Status  . WBC  09/27/2015 10.3  3.8 - 10.6 K/uL Final  . RBC 09/27/2015 4.58  4.40 - 5.90 MIL/uL Final  . Hemoglobin 09/27/2015 14.4  13.0 - 18.0 g/dL Final  . HCT 09/27/2015 40.9  40.0 - 52.0 % Final  . MCV 09/27/2015 89.4  80.0 - 100.0 fL Final  . MCH 09/27/2015 31.4  26.0 - 34.0 pg Final  . MCHC 09/27/2015 35.1  32.0 - 36.0 g/dL Final  . RDW 09/27/2015 16.2* 11.5 - 14.5 % Final  . Platelets 09/27/2015 354  150 - 440 K/uL Final  . Neutrophils Relative % 09/27/2015 58   Final  . Neutro Abs 09/27/2015 6.0  1.4 - 6.5 K/uL Final  . Lymphocytes Relative 09/27/2015 27   Final  . Lymphs Abs 09/27/2015 2.8  1.0 - 3.6 K/uL Final  . Monocytes Relative 09/27/2015 13   Final  . Monocytes Absolute 09/27/2015 1.3* 0.2 - 1.0 K/uL Final  . Eosinophils Relative 09/27/2015 1   Final  . Eosinophils Absolute 09/27/2015 0.1  0 - 0.7 K/uL Final  . Basophils Relative 09/27/2015 1   Final  . Basophils Absolute 09/27/2015 0.1  0 - 0.1 K/uL Final  . Sodium 09/27/2015 135  135 - 145 mmol/L Final  . Potassium 09/27/2015 4.0  3.5 - 5.1 mmol/L Final  . Chloride 09/27/2015 103  101 - 111 mmol/L Final  . CO2 09/27/2015 25  22 - 32 mmol/L Final  . Glucose, Bld 09/27/2015 165* 65 - 99 mg/dL Final  . BUN 09/27/2015 20  6 - 20 mg/dL Final  . Creatinine, Ser 09/27/2015 0.80  0.61 - 1.24 mg/dL Final  . Calcium 09/27/2015 9.2  8.9 - 10.3 mg/dL Final  . Total Protein 09/27/2015 8.0  6.5 - 8.1 g/dL Final  . Albumin 09/27/2015 4.0  3.5 - 5.0 g/dL Final  . AST 09/27/2015 19  15 - 41 U/L Final  . ALT 09/27/2015 25  17 - 63 U/L Final  . Alkaline Phosphatase 09/27/2015 106  38 - 126 U/L Final  . Total Bilirubin 09/27/2015 0.5  0.3 - 1.2 mg/dL Final  . GFR calc non Af Amer 09/27/2015 >60  >60 mL/min Final  . GFR calc Af Amer 09/27/2015 >60  >60 mL/min Final   Comment: (NOTE) The eGFR has been calculated using the CKD EPI equation. This calculation has not been validated in all clinical situations. eGFR's persistently <60 mL/min signify  possible Chronic Kidney Disease.   . Anion gap 09/27/2015 7  5 - 15 Final  . CEA 09/27/2015 399.6* 0.0 - 4.7 ng/mL Final   Comment: (NOTE)  Roche ECLIA methodology       Nonsmokers  <3.9                                     Smokers     <5.6 Performed At: [facility] [place], [place] [id number] Lindon Romp MD SW:[id number]   . Magnesium 09/27/2015 2.0  1.7 - 2.4 mg/dL Final    Assessment:  KELLY EISLER is a 55 y.o. male with extensive stage small cell lung cancer.  He presented with a 2 month history of left hip pain.  CXR revealed a nodular density in the right upper lobe.  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  MSI/MMR testing revealed loss of nuclear staining in MLH-1, MLH-2, and PMS-2 c/w microsatellite instability.  CT scan of the left hip on 06/04/2015 revealed a 3.3 x 2.5 cm destructive lesion in the left parasymphyseal pubic bone and superior pubic ramus.  Surrounding sift tissues demonstrated a low-attenuation soft tissue component of the lesion.  There was subtle lucency of the left greater trochanter and a small focus of cortical destruction along the anterior aspect of the greater trochanter.  There was also lucency in the periphery of the left femoral head without cortical disruption.  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 There was a dominant 2.0 x 1.8 cm right upper lobe pulmonary nodule (SUV 8.9) with thoracic nodal, hepatic, and multifocal osseous metastasis.  Examples within the left pubic bone at a SUV of 11.3, left transverse process at T7 (SUV of 8.9), C4 vertebral body (SUV of 5.7), lateral eighth left rib (SUV of 6.3.).  There was a 3.8 cm right hepatic lobe hypo-attenuating mass (SUV 8.4).    Head MRI on 06/16/2015 revealed no evidence for metastatic disease.   He receives [place] monthly (began 06/23/2015).   He underwent 2 dental extractions on 07/13/2015.  He is s/p 4 cycles of carboplatin and etoposide (06/22/2105 - 09/06/2015) with Neulasta support.   CEA was 912.6 on 06/23/2015, 429.8 on 07/13/2015, 371.6 on 08/16/2015, 376.5 on 09/06/2015, and 399.6 on 09/27/2015.    Chest, abdomen, and pelvic CT scan on 08/06/2015 suggested a mixed response to therapy. The primary bronchogenic carcinoma the right upper lobe was mildly decreased in size.  There was a questionable new anterior medial subpleural soft tissue density in the right upper lobe.  There was stable right paratracheal adenopathy.  There was stable to slightly improved appearance of multifocal liver metastasis.  There was stable appearance of lytic lesion involving the left pubic bone.  PET scan on 08/11/2015 revealed questionable slight progression compared to prior examinations with interval enlargement of pulmonary nodules in the right upper lobe, and slight interval growth of the large metastatic lesion within the right lobe of the liver (difficult to assess on PET). Other metastatic lesions to the bones appear very similar.  Review of imaging with radiology revealed no clear progression.  Chest, abdomen, and pelvic CT scan on 09/22/2015 revealed stable primary tumor in the basilar right upper lobe.  There was mild interval growth (2-4 mm) of separate basilar right upper lobe and two adjacent central right middle lobe pulmonary nodules worrisome for progressive pulmonary metastases.  There was stable right hilar and right paratracheal nodal metastases.  There was stable liver metastases.  There was increased sclerosis within the  left T7 transverse process, left 8th rib and left pubic symphysis bone metastases.  Symptomatically, he is having dental issues.   Exam is stable.  He trying to stop smoking.  Plan: 1.  Labs today:  CBC with diff, CMP, CEA, Mg.    2.  Review scans.  Discuss direction of therapy.  Scans suggest stable disease with  potentially some early progression (2-4 mm).  Discuss no indication for continued carboplatin and etoposide.  Discuss consideration of Keytruda given MSI-H. Discuss recent FDA approval for [place] with MSI-H and MMR tumors.  Original trial noted 40% CR or PR and responses lasting 6 months or more in 78% of those patients.  Discuss enrollment on clinical trial.  Discuss local Abbvie [id number] (Rova-T/placebo every 6 weeks).  Discuss rovalpituzumab tesirine, a delta-like 3-targeted antibody-drug conjugate.  Preliminary data regarding efficacy reviewed.  Discuss CheckMate 451 trial- nivolumab, nivolumab + ipilumumab or placebo.  3.  Discuss prophylactic cranial radiation.  Discuss PCI typically given for patient's with CR or PR.  Patient has stable disease, but excellent performance status.  Discuss follow-up head MRI. 4.  Discuss second opinion at [facility] with Dr. Consuello Masse. 5.  Schedule head MRI. 6.  Preauth Keytruda 7.  Second opinion at [facility] with Dr. Consuello Masse. 8.  RTC after head MRI and second opinion at [facility].   Lequita Asal, MD  09/27/2015, 9:33 AM

## 2015-09-27 NOTE — Progress Notes (Signed)
Cough improved overall feels better.  Continues with pain in left rib cage and jaw.  Pt being followed by dentist for jaw pain.  SOB on exertion.

## 2015-09-27 NOTE — Telephone Encounter (Signed)
Chris Mason is ok with this if you are, thanks.

## 2015-09-27 NOTE — Telephone Encounter (Signed)
yes

## 2015-09-27 NOTE — Progress Notes (Signed)
PFT performed today. 

## 2015-09-27 NOTE — Telephone Encounter (Signed)
Pt came in today for PFT and states he would like to see Dr. Alva Garnet instead of Dr. Ashby Dawes. States Dr. Alva Garnet had been referred by a family member. Are you guys ok with the switch? Thanks.

## 2015-09-28 ENCOUNTER — Inpatient Hospital Stay: Payer: Medicaid Other

## 2015-09-28 LAB — CEA: CEA: 399.6 ng/mL — ABNORMAL HIGH (ref 0.0–4.7)

## 2015-09-29 ENCOUNTER — Telehealth: Payer: Self-pay | Admitting: *Deleted

## 2015-09-29 ENCOUNTER — Inpatient Hospital Stay: Payer: Medicaid Other

## 2015-09-30 ENCOUNTER — Other Ambulatory Visit: Payer: Self-pay

## 2015-09-30 ENCOUNTER — Telehealth: Payer: Self-pay

## 2015-09-30 MED ORDER — OXYCODONE HCL 10 MG PO TABS: 10.0000 mg | ORAL_TABLET | ORAL | Status: DC | PRN

## 2015-09-30 NOTE — Telephone Encounter (Signed)
Returned pt's phone call.  Pt verbalized that he had just spent all day at [place] his sister had him go there to see a Psychologist, sport and exercise.  Pt verbalized the only way he had gotten to talk to me was by pressing 1 as if he was a doctor.  Pt had previously left a vm and I had not yet returned phone call until at 15:30 when he got through on doctor line.  Pt verbalized he needed his pain medication refilled.  Dr. Mike Gip refilled I verbalized I would leave it at front for him to pick up.  Pt verbalized he would pick up before his beach trip tomorrow

## 2015-10-01 ENCOUNTER — Telehealth: Payer: Self-pay | Admitting: *Deleted

## 2015-10-01 ENCOUNTER — Telehealth: Payer: Self-pay

## 2015-10-01 NOTE — Telephone Encounter (Signed)
Per Theadora Rama, she called and left a message on patients VM

## 2015-10-01 NOTE — Telephone Encounter (Signed)
Called to report that his leg has started hurting again and he is afraid the cancer is causing it, he did note that his pain goes away with chemo and thinks the association of getting steroids with his chemo may be helping his pain. Also reports that he left some papers with [place] for Dr Mike Gip to look over regarding a study the surgeon at [facility] wants him to participate in, this is aside form the referral to Dr Kinnie Scales, he reports he has heard nothing from them about an appt to see Dr Kinnie Scales. He states he appreciates the pain med he got , but feels it is just a band aid for the real problem. Please advise

## 2015-10-01 NOTE — Telephone Encounter (Signed)
Called and left VM on pt's personal VM.  I called and spoke with Dr. Kinnie Scales office they verbalized they received all the information and they will call him the first of next week.  Called the office at [phone].  I left that on pt's voicemail.  Per Dr. Mike Gip we cannot do any steroids prescription for pain and we can readdress his pain at next appt.  Also left that on VM.  I asked pt to keep a pain diary in the VM also.  Pt dropped off info on study I also verbalized on VM Dr. Mike Gip had reviewed the paperwork and it is the exact same study she offered him here with a change in percent of chance he would get the placebo.  Verbalized if pt had any other concerns please call us back.

## 2015-10-01 NOTE — Telephone Encounter (Signed)
DS is ok with seeing pt from this point on. Will call pt to schedule f/u appt with DS.

## 2015-10-04 ENCOUNTER — Encounter: Payer: Self-pay | Admitting: Pulmonary Disease

## 2015-10-04 ENCOUNTER — Telehealth: Payer: Self-pay | Admitting: *Deleted

## 2015-10-04 ENCOUNTER — Ambulatory Visit (INDEPENDENT_AMBULATORY_CARE_PROVIDER_SITE_OTHER): Payer: Medicaid Other | Admitting: Pulmonary Disease

## 2015-10-04 VITALS — BP 122/88 | HR 77 | Ht 70.5 in | Wt 216.8 lb

## 2015-10-04 DIAGNOSIS — C3411 Malignant neoplasm of upper lobe, right bronchus or lung: Secondary | ICD-10-CM

## 2015-10-04 DIAGNOSIS — F17200 Nicotine dependence, unspecified, uncomplicated: Secondary | ICD-10-CM

## 2015-10-04 DIAGNOSIS — Z720 Tobacco use: Secondary | ICD-10-CM

## 2015-10-04 MED ORDER — FLUTICASONE FUROATE-VILANTEROL 200-25 MCG/INH IN AEPB: 1.0000 | INHALATION_SPRAY | Freq: Every day | RESPIRATORY_TRACT | Status: DC

## 2015-10-04 NOTE — Telephone Encounter (Signed)
Called to report that the pain med given is not helping relived his pain and states he cannot wait until Friday to be seen. I informed him that per Dr Mike Gip, she was not ordering steroids for him. He stated that he had an absolutely horrible weekend with pain.Please advise

## 2015-10-04 NOTE — Telephone Encounter (Signed)
Advised patient per Dr Mike Gip, he can go to ER or to PCP as she has no availability in her schedule to see him today. He was upset and again repeated that he did not refuse treatment she offered him, but wanted to see if he can take [facility] and said something about an appt with a Dr at [facility]. He said he will discuss this further when he sees her on Friday.

## 2015-10-07 ENCOUNTER — Telehealth: Payer: Self-pay

## 2015-10-07 ENCOUNTER — Ambulatory Visit: Payer: Medicaid Other | Admitting: Internal Medicine

## 2015-10-07 NOTE — Telephone Encounter (Signed)
Patient states that he is in extreme pain, states that pain medication is not helping and that he felt his best while he was on Chemotherapy and felt like it was the steroids that was helping.  Would like to understand why he can not get steroids.

## 2015-10-07 NOTE — Telephone Encounter (Signed)
Patient called expressing severe pain uncontrolled with medication.  Patient does not want to wait for his appt tomorrow for pain control intervention.  Information provided to Dr. Mike Gip.

## 2015-10-07 NOTE — Telephone Encounter (Signed)
  Please call patient at your convenience  M

## 2015-10-08 ENCOUNTER — Inpatient Hospital Stay: Payer: Medicaid Other | Attending: Hematology and Oncology | Admitting: Hematology and Oncology

## 2015-10-08 ENCOUNTER — Other Ambulatory Visit: Payer: Self-pay

## 2015-10-08 ENCOUNTER — Encounter: Payer: Self-pay | Admitting: Hematology and Oncology

## 2015-10-08 ENCOUNTER — Inpatient Hospital Stay: Payer: Medicaid Other

## 2015-10-08 VITALS — BP 132/83 | HR 82 | Temp 97.2°F | Wt 213.0 lb

## 2015-10-08 DIAGNOSIS — R0781 Pleurodynia: Secondary | ICD-10-CM

## 2015-10-08 DIAGNOSIS — C3491 Malignant neoplasm of unspecified part of right bronchus or lung: Secondary | ICD-10-CM

## 2015-10-08 DIAGNOSIS — Z9221 Personal history of antineoplastic chemotherapy: Secondary | ICD-10-CM

## 2015-10-08 DIAGNOSIS — I10 Essential (primary) hypertension: Secondary | ICD-10-CM | POA: Diagnosis not present

## 2015-10-08 DIAGNOSIS — Z87891 Personal history of nicotine dependence: Secondary | ICD-10-CM

## 2015-10-08 DIAGNOSIS — C3411 Malignant neoplasm of upper lobe, right bronchus or lung: Secondary | ICD-10-CM | POA: Diagnosis not present

## 2015-10-08 DIAGNOSIS — M549 Dorsalgia, unspecified: Secondary | ICD-10-CM

## 2015-10-08 DIAGNOSIS — R590 Localized enlarged lymph nodes: Secondary | ICD-10-CM | POA: Diagnosis not present

## 2015-10-08 DIAGNOSIS — C787 Secondary malignant neoplasm of liver and intrahepatic bile duct: Secondary | ICD-10-CM | POA: Diagnosis not present

## 2015-10-08 DIAGNOSIS — C7951 Secondary malignant neoplasm of bone: Secondary | ICD-10-CM

## 2015-10-08 DIAGNOSIS — E039 Hypothyroidism, unspecified: Secondary | ICD-10-CM | POA: Diagnosis not present

## 2015-10-08 DIAGNOSIS — G893 Neoplasm related pain (acute) (chronic): Secondary | ICD-10-CM

## 2015-10-08 DIAGNOSIS — Z79899 Other long term (current) drug therapy: Secondary | ICD-10-CM | POA: Diagnosis not present

## 2015-10-08 DIAGNOSIS — C414 Malignant neoplasm of pelvic bones, sacrum and coccyx: Secondary | ICD-10-CM

## 2015-10-08 MED ORDER — OXYCODONE HCL 10 MG PO TABS: 10.0000 mg | ORAL_TABLET | ORAL | Status: DC | PRN

## 2015-10-08 MED ORDER — OXYCODONE HCL ER 20 MG PO T12A: 20.0000 mg | EXTENDED_RELEASE_TABLET | Freq: Two times a day (BID) | ORAL | Status: DC

## 2015-10-08 NOTE — Progress Notes (Signed)
[facility] [facility] day:  10/08/2015   Chief Complaint: Chris Mason is a 55 y.o. male with extensive stage small cell lung cancer who is seen for review of interval head MRI and further discussion regarding direction of therapy after consultation at [facility].  HPI:   The patient was last seen by in the medical oncology clinic on 09/27/2015.  At that time, end of therapy scans were reviewed.  He was felt to have relatively stable disease.  We discussed consideration of 2 clinical trials (Rova-T versus Checkmate 451 trial - nivolumab, nivolumab + ipilumumab or placebo).  He was not interested in either trial seondary to the possibility of receiving a placebo.  He was interested in [place] given his MSI-H tumor status.  He was to see physicians at [facility] for a second opinion on 09/30/2015.  He saw Dr. Lissa Morales at the [facility].  He recommended the patient pursue either the nivolumab, nivolumab + ipilumumab or placebo trial at [facility] or the Rova-T trial at [facility].  The patient has an appointment with Dr. Consuello Masse at [facility] on 10/13/2015.  He underwent head MRI at [facility] on 10/01/2015.   There was no evidence of metastatic disease.  The patient notes ongoing pain in his ribs and back.  Pain was noted with his treatment, but improved after each cycle.  He has called daily asking about receiving steroids.  Yesterday, he states that he took 9-10 pain pills.  He only has 7 pills left.  He is using castor oil and Miralax.   Past Medical History  Diagnosis Date  . Hypertension   . Hyperthyroidism   . Cancer ([place])     right lung mass, left pubic bone lesion, hepatic mass    Past Surgical History  Procedure Laterality Date  . Elbow arthroplasty    . Peripheral vascular catheterization N/A 07/08/2015    Procedure: Glori Luis Cath Insertion;  Surgeon: Algernon Huxley, MD;  Location: [place] [facility];  Service: Cardiovascular;  Laterality: N/A;     Family History  Problem Relation Age of Onset  . Hypertension Mother   . Cancer Paternal Aunt     Breast     Social History:  reports that he has been smoking Cigarettes.  He has a 35 pack-year smoking history. He does not have any smokeless tobacco history on file. He reports that he does not drink alcohol or use illicit drugs.  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  His grand child is being born today.  The patient is accompanied by his girlfriend today.  Allergies: No Known Allergies  Current Medications: Current Outpatient Prescriptions  Medication Sig Dispense Refill  . atorvastatin (LIPITOR) 20 MG tablet Take 20 mg by mouth daily.    . fluticasone furoate-vilanterol (BREO ELLIPTA) 200-25 MCG/INH AEPB Inhale 1 puff into the lungs daily. 60 each 5  . lidocaine-prilocaine (EMLA) cream Apply 1 application topically as needed. Apply 1/2 Tablespoon 1-2 hours before port use and cover. 30 g 0  . LORazepam (ATIVAN) 1 MG tablet 1/2 tablet to 1 tablet every 6 hours as needed for anxiety 40 tablet 0  . losartan-hydrochlorothiazide (HYZAAR) 50-12.5 MG per tablet Take 1 tablet by mouth daily.    . magic mouthwash w/lidocaine SOLN Take 10 mLs by mouth 4 (four) times daily as needed for mouth pain. 480 mL 3  .  Oxycodone HCl 10 MG TABS Take 1 tablet (10 mg total) by mouth every 4 (four) hours as needed. 60 tablet 0  . tiotropium (SPIRIVA HANDIHALER) 18 MCG inhalation capsule Place 1 capsule (18 mcg total) into inhaler and inhale daily. 30 capsule 6   No current facility-administered medications for this visit.   Review of Systems:  GENERAL:  Feels good.  No fevers or sweats.  Weight down 4 pounds. PERFORMANCE STATUS (ECOG):  1 HEENT:  Bone protruding from right lower jaw.  No visual changes, runny nose, sore throat, mouth sores or tenderness. Lungs:  Shortness of breath on exertion.  Cough, improved.  No  hemoptysis. Cardiac:  No chest pain, palpitations, orthopnea, or PND. GI:  Constipation secondary to narcotics.  No nausea, vomiting, diarrhea, melena or hematochezia.  No prior colonoscopy.  Hemorrhoids. GU:  No urgency, frequency, dysuria, or hematuria. Musculoskeletal:  Left rib pain.  Back pain.  Jaw pain.  No muscle tenderness. Extremities:  No pain or swelling. Skin:  No rashes, skin changes, or ulcers. Neuro:  No headache, numbness or weakness, balance or coordination issues. Endocrine:  No diabetes, thyroid issues, hot flashes or night sweats. Psych:  Anxiety.  No mood changes or depression. Pain:  Pain poorly controlled. Review of systems:  All other systems reviewed and found to be negative.  Physical Exam: Blood pressure 132/83, pulse 82, temperature 97.2 F (36.2 C), temperature source Tympanic, weight 212 lb 15.4 oz (96.6 kg). GENERAL:  Well developed, well nourished, gentleman sitting in the exam room with a pillow on his left side in no acute distress.  MENTAL STATUS:  Alert and oriented to person, place and time. HEAD:  Short brown hair thinning with graying goatee.  Normocephalic, atraumatic, face symmetric, no Cushingoid features. EYES:  No conjunctivitis or scleral icterus. ENT:  Oropharynx clear without lesion. Right lower jaw with visible bone above gum line.  Tongue normal.  Mucous membranes moist.  NEUROLOGICAL: Unremarkable. PSYCH:  Appropriate.   No visits with results within 3 Day(s) from this visit. Latest known visit with results is:  Clinical Support on 09/27/2015  Component Date Value Ref Range Status  . FVC-Pre 09/27/2015 3.39   Preliminary  . FVC-%Pred-Pre 09/27/2015 68   Preliminary  . FVC-Post 09/27/2015 3.45   Preliminary  . FVC-%Pred-Post 09/27/2015 69   Preliminary  . FVC-%Change-Post 09/27/2015 1   Preliminary  . FEV1-Pre 09/27/2015 2.55   Preliminary  . FEV1-%Pred-Pre 09/27/2015 66   Preliminary  . FEV1-Post 09/27/2015 2.64   Preliminary  .  FEV1-%Pred-Post 09/27/2015 68   Preliminary  . FEV1-%Change-Post 09/27/2015 3   Preliminary  . FEV6-Pre 09/27/2015 3.36   Preliminary  . FEV6-%Pred-Pre 09/27/2015 70   Preliminary  . FEV6-Post 09/27/2015 3.44   Preliminary  . FEV6-%Pred-Post 09/27/2015 71   Preliminary  . FEV6-%Change-Post 09/27/2015 2   Preliminary  . Pre FEV1/FVC ratio 09/27/2015 75   Preliminary  . FEV1FVC-%Pred-Pre 09/27/2015 97   Preliminary  . Post FEV1/FVC ratio 09/27/2015 77   Preliminary  . FEV1FVC-%Change-Post 09/27/2015 1   Preliminary  . Pre FEV6/FVC Ratio 09/27/2015 99   Preliminary  . FEV6FVC-%Pred-Pre 09/27/2015 103   Preliminary  . Post FEV6/FVC ratio 09/27/2015 100   Preliminary  . FEV6FVC-%Pred-Post 09/27/2015 104   Preliminary  . FEV6FVC-%Change-Post 09/27/2015 0   Preliminary  . FEF 25-75 Pre 09/27/2015 2.01   Preliminary  . FEF2575-%Pred-Pre 09/27/2015 60   Preliminary  . FEF 25-75 Post 09/27/2015 2.40   Preliminary  . FEF2575-%Pred-Post  09/27/2015 72   Preliminary  . FEF2575-%Change-Post 09/27/2015 19   Preliminary  . RV 09/27/2015 2.20   Preliminary  . RV % pred 09/27/2015 104   Preliminary  . TLC 09/27/2015 5.74   Preliminary  . TLC % pred 09/27/2015 82   Preliminary  . DLCO unc 09/27/2015 33.13   Preliminary  . DLCO unc % pred 09/27/2015 102   Preliminary  . DL/VA 09/27/2015 4.15   Preliminary  . DL/VA % pred 09/27/2015 89   Preliminary    Assessment:  Chris Mason is a 55 y.o. male with extensive stage small cell lung cancer.  He presented with a 2 month history of left hip pain.  CXR revealed a nodular density in the right upper lobe.  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  MSI/MMR testing revealed loss of nuclear staining in MLH-1, MLH-2, and PMS-2 c/w microsatellite instability.  CT scan of the left hip on 06/04/2015 revealed a 3.3 x 2.5 cm destructive lesion in the left parasymphyseal pubic bone and superior pubic ramus.  Surrounding  sift tissues demonstrated a low-attenuation soft tissue component of the lesion.  There was subtle lucency of the left greater trochanter and a small focus of cortical destruction along the anterior aspect of the greater trochanter.  There was also lucency in the periphery of the left femoral head without cortical disruption.  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 There was a dominant 2.0 x 1.8 cm right upper lobe pulmonary nodule (SUV 8.9) with thoracic nodal, hepatic, and multifocal osseous metastasis.  Examples within the left pubic bone at a SUV of 11.3, left transverse process at T7 (SUV of 8.9), C4 vertebral body (SUV of 5.7), lateral eighth left rib (SUV of 6.3.).  There was a 3.8 cm right hepatic lobe hypo-attenuating mass (SUV 8.4).    Head MRI on 06/16/2015 revealed no evidence for metastatic disease.   He receives [place] monthly (began 06/23/2015).  He underwent 2 dental extractions on 07/13/2015.  He is s/p 4 cycles of carboplatin and etoposide (06/22/2105 - 09/06/2015) with Neulasta support.   CEA was 912.6 on 06/23/2015, 429.8 on 07/13/2015, 371.6 on 08/16/2015, 376.5 on 09/06/2015, and 399.6 on 09/27/2015.    Chest, abdomen, and pelvic CT scan on 08/06/2015 suggested a mixed response to therapy. The primary bronchogenic carcinoma the right upper lobe was mildly decreased in size.  There was a questionable new anterior medial subpleural soft tissue density in the right upper lobe.  There was stable right paratracheal adenopathy.  There was stable to slightly improved appearance of multifocal liver metastasis.  There was stable appearance of lytic lesion involving the left pubic bone.  PET scan on 08/11/2015 revealed questionable slight progression compared to prior examinations with interval enlargement of pulmonary nodules in the right upper lobe, and slight interval growth of the large metastatic lesion  within the right lobe of the liver (difficult to assess on PET). Other metastatic lesions to the bones appear very similar.  Review of imaging with radiology revealed no clear progression.  Chest, abdomen, and pelvic CT scan on 09/22/2015 revealed stable primary tumor in the basilar right upper lobe.  There was mild interval growth (2-4 mm) of separate basilar right upper lobe and two adjacent central right middle lobe pulmonary nodules worrisome for progressive pulmonary metastases.  There was stable right hilar and right paratracheal nodal metastases.  There was stable liver metastases.  There was increased sclerosis within the left T7 transverse process, left 8th rib and left pubic symphysis bone metastases.  Head MRI at [facility] on 10/01/2015 revealed no evidence of metastatic disease.  Symptomatically, he has poorly controlled rib pain.  Plan: 1.  Discuss Head MRI. 2.  Discuss second opinion at [facility].  Discuss NCCN guidlines.  Re-review 2 clinical trials.  Close follow-up on trials.  Discuss that steroids would preclude enrollment on Rova-T trial. 3.  Discuss palliative radiation.  Discuss local XRT after completion of therapy would preclude enrollment on Rova-T trial. 4.  Discuss starting long acting pain medication.  Discuss pills versus Fentanyl patch.  Patient requests pills only.  Discuss Oxycontin q 12 hours.  Patient not to bite or chew Oxycontin.  Discuss keeping a pain diary and adjusting dose as needed. 5.  Patient notes follow-up with Dr. Kinnie Scales (10/13/2015) then decision about direction of therapy. 6.  Rx:  Oxycontin 20 mg po q 12 hours. 7.  Refill oxycodone 10 mg po q 4-6 hours pen. 8.  RTC in 1 week for MD assessment.   Lequita Asal, MD  10/08/2015, 1:41 PM

## 2015-10-08 NOTE — Progress Notes (Signed)
Patient here for results/follow up. Patient is distressed over increasing pain and discomfort. States that while on chemo pain subsided but now is often at a 10 despite pain medication. Patient thinks that steroids would help.

## 2015-10-12 NOTE — Progress Notes (Signed)
PROBLEMS: Small cell cancer of lung, extensive stage Smoker Wheezing  SUBJ: 55 y.o. M diagnosed with metastatic small cell cancer of the lung in March of 2017 by liver biopsy. He is undergoing treatment under the supervision of Dr Mike Gip since then. He has also been seen by Dr Ashby Dawes for wheezing, dyspnea and smoking. He was dissatisfied with that encounter and asked to be seen by me as another provider. He complains of intermittent wheezing and dyspnea. He also reports cancer related pain - in particular L sided chest pain. He continues to smoke up to one ppd. Mostly, he expresses frustration over lack of communication by his physicians and "feeling abandoned". He has been referred by Dr Mike Gip to [facility] and is not sure why this referral has been made. He Denies fever, purulent sputum, hemoptysis, LE edema and calf tenderness.   OBJDanley Danker Vitals:   10/04/15 1202  BP: 122/88  Pulse: 77  Height: 5' 10.5" (1.791 m)  Weight: 216 lb 12.8 oz (98.34 kg)  SpO2: 99%   NAD but overtly frustrated HEENT WNL No JVD No wheezes RRR s M NABS, soft No C/C/E No focal neuro deficits   DATA: PFTs (09/27/15): No obstruction, TLC normal but borderline gas trapping (RV/TLC 123% predicted), normal DLCO  IMPRESSION: Extensive small cell cancer Smoker  Chronic bronchitis with intermittent wheezing  He is very frustrated with his situation and with what he perceives to be poor communication by his care providers. He strongly expresses a sense of abandonment.   PLAN: I spoke with him at length re: his current diagnoses. I offered my perspective re: the proper course of action and encouraged that he keep appointment with [facility] as scheduled by Dr Mike Gip. I reviewed Dr Kem Parkinson last note with him and offered to share his expressed frustrations with her. Lastly, I encouraged him that smoking cessation, even at this point, would be potentially beneficial. ROV 4 weeks   Merton Border,  MD PCCM service Mobile [phone] Pager [phone] 10/12/2015

## 2015-10-13 ENCOUNTER — Other Ambulatory Visit: Payer: Self-pay | Admitting: Hematology and Oncology

## 2015-10-13 ENCOUNTER — Telehealth: Payer: Self-pay | Admitting: *Deleted

## 2015-10-13 NOTE — Telephone Encounter (Signed)
Called pt and he states that he did not call, it must have been his girlfriend.  He had chills in the middle of the night and while I was on the phone he checked it 99.8. He had left eye matted up  This am with yellow stuff. Hurting on left side at rib area and left side of back at shoulder. His only relief is taking pain pill and laying on left side and laying on arm and the pain gets better.  He is laying around all the time due to the pain is so much worse when he gets up.  I will check with md and let him know about the back pain. He should take tylenol 2 tablets today to see if that will help with temp. But do not feel that it is related to chemo or cancer.  He will keep his appt with garst for today

## 2015-10-13 NOTE — Telephone Encounter (Signed)
-----   Message from Shawnee Knapp, RN sent at 10/13/2015 11:00 AM EDT ----- Regarding: FEVER Almyra Free called on behalf of Chris Mason b/c he is having chills/fever of 100.4 and is feeling poorly.  They have an appt this afternoon with the doctor at [facility] and she wanted to know if there is anything he can take?  He has been told he cannot have tylenol or ibuprofen.

## 2015-10-14 NOTE — Telephone Encounter (Signed)
Called pt late in the evening on 7/12. Pt went to see dr Kinnie Scales and said that she wants him to have 2 more treatments of carbo, vp16 and then she has more options for him before she would get to clinical trials.  He told her about his pain and fever.  She listened to his lungs and gave him atb since the fever and wheezing.  Then he is sch. For [facility] to get next chemo there and if he does well she will try to have the next one done here.  She rec: radiation and he was ok with itI told pt that dr Mike Gip had also mentioned to him that he could have it for his pain but wanted to wait until after the appt with garst so if she wanted him to go on clinical trial it would not keep him from getting on by having radiation.  He wants me to cancel his appt for Friday and he will call back when another appt needed. He is expecting a call from [facility] with his next chemo appt at [facility]

## 2015-10-15 ENCOUNTER — Ambulatory Visit
Admission: RE | Admit: 2015-10-15 | Discharge: 2015-10-15 | Disposition: A | Discharge status: Home / Self Care | Payer: Medicaid Other | Source: Ambulatory Visit | Attending: Radiation Oncology | Admitting: Radiation Oncology

## 2015-10-15 ENCOUNTER — Encounter: Payer: Self-pay | Admitting: Radiation Oncology

## 2015-10-15 ENCOUNTER — Inpatient Hospital Stay: Payer: Medicaid Other | Admitting: Hematology and Oncology

## 2015-10-15 VITALS — BP 123/73 | HR 81 | Temp 97.7°F | Wt 214.7 lb

## 2015-10-15 DIAGNOSIS — C7951 Secondary malignant neoplasm of bone: Secondary | ICD-10-CM | POA: Insufficient documentation

## 2015-10-15 DIAGNOSIS — R102 Pelvic and perineal pain: Secondary | ICD-10-CM | POA: Insufficient documentation

## 2015-10-15 DIAGNOSIS — C787 Secondary malignant neoplasm of liver and intrahepatic bile duct: Secondary | ICD-10-CM | POA: Diagnosis not present

## 2015-10-15 DIAGNOSIS — R0781 Pleurodynia: Secondary | ICD-10-CM | POA: Insufficient documentation

## 2015-10-15 NOTE — Progress Notes (Signed)
Radiation Oncology Follow up Note  Name: Chris Mason   Date:   10/15/2015 MRN:  [id number] DOB: January 08, 1961    This 55 y.o. male presents to the clinic today for evaluation for palliative radiation therapy in patient with known extensive stage small cell lung cancer.Marland Kitchen  REFERRING PROVIDER: [facility]  HPI: Patient is a 55 year old male with unfortunately extensive stage small cell lung cancer who presented initially with bone metastasis. His initial presentation involved left lateral ribs as well as his pelvis. He initially had a 3 cm destructive lesion in the symphysis pubis as well as left lateral ribs any lesion at T7. He also has a 3 cm right upper lobe primary lung mass. Patient also has known biopsy-positive metastatic disease in his liver. He was started on carboplatinum and etoposide has done fairly well with excellent response as far as pain in his metastatic lesions are concerned. His disease is been stable although his been seen for 2 second opinions at [facility] and patient has opted to continue with chemotherapy and pursue palliative radiation therapy as recommended by Dr. Kinnie Scales at [facility] on 10/13/2015. Interesting he is pelvic pain and rib pain are of a concern although he is mostly complaining about mid back pain and does have pedicle involvement at T7. He's having no difficulty ambulating or no motor or sensory levels noted. He is seen today for consideration of palliative radiation therapy to that region.   COMPLICATIONS OF TREATMENT: none  FOLLOW UP COMPLIANCE: keeps appointments   PHYSICAL EXAM:  BP 123/73 mmHg  Pulse 81  Temp(Src) 97.7 F (36.5 C)  Wt 214 lb 11.7 oz (97.4 kg) Range of motion of his lower extremities does not elicit pain no motor or sensory levels are appreciated. Deep palpation of his spine doesn't elicit slight pain. Well-developed well-nourished patient in NAD. HEENT reveals PERLA, EOMI, discs not visualized.  Oral cavity is clear. No  oral mucosal lesions are identified. Neck is clear without evidence of cervical or supraclavicular adenopathy. Lungs are clear to A&P. Cardiac examination is essentially unremarkable with regular rate and rhythm without murmur rub or thrill. Abdomen is benign with no organomegaly or masses noted. Motor sensory and DTR levels are equal and symmetric in the upper and lower extremities. Cranial nerves II through XII are grossly intact. Proprioception is intact. No peripheral adenopathy or edema is identified. No motor or sensory levels are noted. Crude visual fields are within normal range.  RADIOLOGY RESULTS: Serial PET/CT scans and CT scans are reviewed  PLAN: At this time like to start with his thoracic spine as this may be the cause of significant pain in his back as well as referral to his left flank. I will plan on delivering 3000 cGy in 10 fractions to this area. Risks and benefits of treatment including possible dysphasia possible skin reaction fatigue alteration of blood counts all were discussed in detail with the patient. I've also explained to the patient any time should his pain in his pelvis or left ribs become an issue concerning the treatment with palliation to those areas at any time. I personally set up and ordered CT simulation for early next week. I have discussed case personally with medical oncology. Unfortunately this is a stage IV disease with significant organ and bone involvement.  I would like to take this opportunity to thank you for allowing me to participate in the care of your patient.Armstead Peaks., MD

## 2015-10-20 ENCOUNTER — Other Ambulatory Visit
Admission: RE | Admit: 2015-10-20 | Discharge: 2015-10-20 | Disposition: A | Discharge status: Home / Self Care | Payer: Medicaid Other | Source: Ambulatory Visit | Attending: Internal Medicine | Admitting: Internal Medicine

## 2015-10-20 ENCOUNTER — Ambulatory Visit
Admission: RE | Admit: 2015-10-20 | Discharge: 2015-10-20 | Disposition: A | Discharge status: Home / Self Care | Payer: Medicaid Other | Source: Ambulatory Visit | Attending: Radiation Oncology | Admitting: Radiation Oncology

## 2015-10-20 DIAGNOSIS — C7951 Secondary malignant neoplasm of bone: Secondary | ICD-10-CM | POA: Diagnosis not present

## 2015-10-20 DIAGNOSIS — Z08 Encounter for follow-up examination after completed treatment for malignant neoplasm: Secondary | ICD-10-CM | POA: Diagnosis not present

## 2015-10-20 LAB — CBC WITH DIFFERENTIAL/PLATELET
Basophils Absolute: 0.2 10*3/uL — ABNORMAL HIGH (ref 0–0.1)
Basophils Relative: 1 %
Eosinophils Absolute: 0.1 10*3/uL (ref 0–0.7)
Eosinophils Relative: 0 %
HEMATOCRIT: 37 % — AB (ref 40.0–52.0)
HEMOGLOBIN: 12.5 g/dL — AB (ref 13.0–18.0)
LYMPHS ABS: 3.5 10*3/uL (ref 1.0–3.6)
MCH: 30.1 pg (ref 26.0–34.0)
MCHC: 33.7 g/dL (ref 32.0–36.0)
MCV: 89.3 fL (ref 80.0–100.0)
Monocytes Absolute: 0.4 10*3/uL (ref 0.2–1.0)
Neutro Abs: 49.1 10*3/uL — ABNORMAL HIGH (ref 1.4–6.5)
Platelets: 274 10*3/uL (ref 150–440)
RBC: 4.14 MIL/uL — AB (ref 4.40–5.90)
RDW: 14.3 % (ref 11.5–14.5)
WBC: 53.4 10*3/uL — AB (ref 3.8–10.6)

## 2015-10-20 LAB — COMPREHENSIVE METABOLIC PANEL
ALK PHOS: 62 U/L (ref 38–126)
ALT: 27 U/L (ref 17–63)
ANION GAP: 10 (ref 5–15)
AST: 18 U/L (ref 15–41)
Albumin: 3.5 g/dL (ref 3.5–5.0)
BILIRUBIN TOTAL: 0.6 mg/dL (ref 0.3–1.2)
BUN: 32 mg/dL — ABNORMAL HIGH (ref 6–20)
CALCIUM: 9.2 mg/dL (ref 8.9–10.3)
CO2: 22 mmol/L (ref 22–32)
CREATININE: 0.95 mg/dL (ref 0.61–1.24)
Chloride: 101 mmol/L (ref 101–111)
Glucose, Bld: 190 mg/dL — ABNORMAL HIGH (ref 65–99)
Potassium: 4 mmol/L (ref 3.5–5.1)
Sodium: 133 mmol/L — ABNORMAL LOW (ref 135–145)
TOTAL PROTEIN: 6.9 g/dL (ref 6.5–8.1)

## 2015-10-21 DIAGNOSIS — C7951 Secondary malignant neoplasm of bone: Secondary | ICD-10-CM | POA: Diagnosis not present

## 2015-10-25 ENCOUNTER — Telehealth: Payer: Self-pay | Admitting: *Deleted

## 2015-10-25 ENCOUNTER — Other Ambulatory Visit: Payer: Self-pay | Admitting: *Deleted

## 2015-10-25 MED ORDER — OXYCODONE HCL 15 MG PO TABS: ORAL_TABLET | ORAL | 0 refills | Status: DC

## 2015-10-25 NOTE — Telephone Encounter (Signed)
[facility] , Hassan Rowan working with Dr Kinnie Scales are in the middle of getting long acting 30 mg oxyconin approved for pt but he needs refill of his roxicodone 15 mg tablets and wants [facility] to fill it.  The directions is 1 tablet every 3-4 hours # 30.  Spoke to [facility] and she did rx and I called pt and told him he can come pick it up. Then he asked for refill lorazepam. I told him since he is getting care right now at [facility] he should check with them.  Then I got another call from nurse navigator brenda asking me to refill his lorazepam.  I check with [facility] and she was ok so I called in refill 1 mg 1/2 to 1 tablet every 6 hours # 60 into [place] and left a note on pt rx to pick up that the other med was called in also

## 2015-10-26 ENCOUNTER — Other Ambulatory Visit: Payer: Self-pay | Admitting: *Deleted

## 2015-10-26 ENCOUNTER — Ambulatory Visit
Admission: RE | Admit: 2015-10-26 | Discharge: 2015-10-26 | Disposition: A | Discharge status: Home / Self Care | Payer: Medicaid Other | Source: Ambulatory Visit | Attending: Radiation Oncology | Admitting: Radiation Oncology

## 2015-10-26 DIAGNOSIS — C3490 Malignant neoplasm of unspecified part of unspecified bronchus or lung: Secondary | ICD-10-CM

## 2015-10-26 DIAGNOSIS — C7951 Secondary malignant neoplasm of bone: Secondary | ICD-10-CM | POA: Diagnosis not present

## 2015-10-27 ENCOUNTER — Ambulatory Visit
Admission: RE | Admit: 2015-10-27 | Discharge: 2015-10-27 | Disposition: A | Discharge status: Home / Self Care | Payer: Medicaid Other | Source: Ambulatory Visit | Attending: Radiation Oncology | Admitting: Radiation Oncology

## 2015-10-27 DIAGNOSIS — C7951 Secondary malignant neoplasm of bone: Secondary | ICD-10-CM | POA: Diagnosis not present

## 2015-10-28 ENCOUNTER — Ambulatory Visit
Admission: RE | Admit: 2015-10-28 | Discharge: 2015-10-28 | Disposition: A | Discharge status: Home / Self Care | Payer: Medicaid Other | Source: Ambulatory Visit | Attending: Radiation Oncology | Admitting: Radiation Oncology

## 2015-10-28 DIAGNOSIS — C7951 Secondary malignant neoplasm of bone: Secondary | ICD-10-CM | POA: Diagnosis not present

## 2015-10-29 ENCOUNTER — Ambulatory Visit
Admission: RE | Admit: 2015-10-29 | Discharge: 2015-10-29 | Disposition: A | Discharge status: Home / Self Care | Payer: Medicaid Other | Source: Ambulatory Visit | Attending: Radiation Oncology | Admitting: Radiation Oncology

## 2015-10-29 DIAGNOSIS — C7951 Secondary malignant neoplasm of bone: Secondary | ICD-10-CM | POA: Diagnosis not present

## 2015-11-01 ENCOUNTER — Ambulatory Visit
Admission: RE | Admit: 2015-11-01 | Discharge: 2015-11-01 | Disposition: A | Discharge status: Home / Self Care | Payer: Medicaid Other | Source: Ambulatory Visit | Attending: Radiation Oncology | Admitting: Radiation Oncology

## 2015-11-01 DIAGNOSIS — C7951 Secondary malignant neoplasm of bone: Secondary | ICD-10-CM | POA: Diagnosis not present

## 2015-11-02 ENCOUNTER — Ambulatory Visit
Admission: RE | Admit: 2015-11-02 | Discharge: 2015-11-02 | Disposition: A | Discharge status: Home / Self Care | Payer: Medicaid Other | Source: Ambulatory Visit | Attending: Radiation Oncology | Admitting: Radiation Oncology

## 2015-11-02 DIAGNOSIS — C7951 Secondary malignant neoplasm of bone: Secondary | ICD-10-CM | POA: Diagnosis not present

## 2015-11-03 ENCOUNTER — Ambulatory Visit
Admission: RE | Admit: 2015-11-03 | Discharge: 2015-11-03 | Disposition: A | Discharge status: Home / Self Care | Payer: Medicaid Other | Source: Ambulatory Visit | Attending: Radiation Oncology | Admitting: Radiation Oncology

## 2015-11-03 DIAGNOSIS — C7951 Secondary malignant neoplasm of bone: Secondary | ICD-10-CM | POA: Diagnosis not present

## 2015-11-04 ENCOUNTER — Ambulatory Visit
Admission: RE | Admit: 2015-11-04 | Discharge: 2015-11-04 | Disposition: A | Discharge status: Home / Self Care | Payer: Medicaid Other | Source: Ambulatory Visit | Attending: Radiation Oncology | Admitting: Radiation Oncology

## 2015-11-04 DIAGNOSIS — C7951 Secondary malignant neoplasm of bone: Secondary | ICD-10-CM | POA: Diagnosis not present

## 2015-11-05 ENCOUNTER — Ambulatory Visit
Admission: RE | Admit: 2015-11-05 | Discharge: 2015-11-05 | Disposition: A | Discharge status: Home / Self Care | Payer: Medicaid Other | Source: Ambulatory Visit | Attending: Radiation Oncology | Admitting: Radiation Oncology

## 2015-11-05 ENCOUNTER — Inpatient Hospital Stay: Payer: Medicaid Other | Attending: Radiation Oncology

## 2015-11-05 DIAGNOSIS — C7951 Secondary malignant neoplasm of bone: Secondary | ICD-10-CM | POA: Diagnosis not present

## 2015-11-05 DIAGNOSIS — C3411 Malignant neoplasm of upper lobe, right bronchus or lung: Secondary | ICD-10-CM | POA: Insufficient documentation

## 2015-11-08 ENCOUNTER — Ambulatory Visit
Admission: RE | Admit: 2015-11-08 | Discharge: 2015-11-08 | Disposition: A | Discharge status: Home / Self Care | Payer: Medicaid Other | Source: Ambulatory Visit | Attending: Radiation Oncology | Admitting: Radiation Oncology

## 2015-11-08 ENCOUNTER — Inpatient Hospital Stay: Payer: Medicaid Other

## 2015-11-08 DIAGNOSIS — C3490 Malignant neoplasm of unspecified part of unspecified bronchus or lung: Secondary | ICD-10-CM

## 2015-11-08 DIAGNOSIS — C7951 Secondary malignant neoplasm of bone: Secondary | ICD-10-CM | POA: Diagnosis not present

## 2015-11-08 DIAGNOSIS — C3411 Malignant neoplasm of upper lobe, right bronchus or lung: Secondary | ICD-10-CM | POA: Diagnosis not present

## 2015-11-08 LAB — CBC
HEMATOCRIT: 40.9 % (ref 40.0–52.0)
Hemoglobin: 14.1 g/dL (ref 13.0–18.0)
MCH: 30.5 pg (ref 26.0–34.0)
MCHC: 34.4 g/dL (ref 32.0–36.0)
MCV: 88.5 fL (ref 80.0–100.0)
PLATELETS: 213 10*3/uL (ref 150–440)
RBC: 4.62 MIL/uL (ref 4.40–5.90)
RDW: 15 % — ABNORMAL HIGH (ref 11.5–14.5)
WBC: 8.2 10*3/uL (ref 3.8–10.6)

## 2015-11-09 ENCOUNTER — Ambulatory Visit
Admission: RE | Admit: 2015-11-09 | Discharge: 2015-11-09 | Disposition: A | Discharge status: Home / Self Care | Payer: Medicaid Other | Source: Ambulatory Visit | Attending: Radiation Oncology | Admitting: Radiation Oncology

## 2015-11-09 DIAGNOSIS — C7951 Secondary malignant neoplasm of bone: Secondary | ICD-10-CM | POA: Diagnosis not present

## 2015-11-10 ENCOUNTER — Ambulatory Visit
Admission: RE | Admit: 2015-11-10 | Discharge: 2015-11-10 | Disposition: A | Discharge status: Home / Self Care | Payer: Medicaid Other | Source: Ambulatory Visit | Attending: Radiation Oncology | Admitting: Radiation Oncology

## 2015-11-10 DIAGNOSIS — C7951 Secondary malignant neoplasm of bone: Secondary | ICD-10-CM | POA: Diagnosis not present

## 2015-11-15 ENCOUNTER — Ambulatory Visit: Payer: Medicaid Other | Admitting: Pulmonary Disease

## 2015-11-18 ENCOUNTER — Telehealth: Payer: Self-pay | Admitting: *Deleted

## 2015-11-18 MED ORDER — OXYCODONE HCL 15 MG PO TABS: ORAL_TABLET | ORAL | 0 refills | Status: DC

## 2015-11-18 NOTE — Telephone Encounter (Signed)
Brenda wilcox lung navigator at [facility] called to say that thye saw pt to day and ordered scans for him and he got refill for long acting pain med. Then after he left he called and said that he needed short acting pain med.  She wants to know since he has left could [facility] order the oxycodone 15 mg q4-6 hours #30 for him to pick up from [facility]. md agreeable and called pt to let him know and he is currently having scan and he wil lpick up rx tom.

## 2015-11-19 ENCOUNTER — Telehealth: Payer: Self-pay | Admitting: *Deleted

## 2015-11-19 NOTE — Telephone Encounter (Signed)
Pt called to say that he had 2 scans yest. At [facility] and they found mets in in cervical area at c 4.  He had wanted to make sure he was never dx with mets in that spot before. His girlfriend Almyra Free remembered [facility] telling them he did have it.  I told him I would review scans and speak to [facility] and he is correct. The first pet scan 06/2015 showed C4 with disease.I called and got his voicemail and gave him above info.  If he has questions he can call me back

## 2015-11-22 ENCOUNTER — Encounter (INDEPENDENT_AMBULATORY_CARE_PROVIDER_SITE_OTHER): Payer: Self-pay

## 2015-11-22 ENCOUNTER — Encounter: Payer: Self-pay | Admitting: Radiation Oncology

## 2015-11-22 ENCOUNTER — Ambulatory Visit
Admission: RE | Admit: 2015-11-22 | Discharge: 2015-11-22 | Disposition: A | Discharge status: Home / Self Care | Payer: Medicaid Other | Source: Ambulatory Visit | Attending: Radiation Oncology | Admitting: Radiation Oncology

## 2015-11-22 VITALS — BP 130/91 | HR 81 | Temp 98.0°F | Wt 211.8 lb

## 2015-11-22 DIAGNOSIS — C7951 Secondary malignant neoplasm of bone: Secondary | ICD-10-CM | POA: Diagnosis not present

## 2015-11-22 DIAGNOSIS — C3490 Malignant neoplasm of unspecified part of unspecified bronchus or lung: Secondary | ICD-10-CM | POA: Insufficient documentation

## 2015-11-22 NOTE — Progress Notes (Signed)
Radiation Oncology Follow up Note  Name: Chris Mason   Date:   11/22/2015 MRN:  [id number] DOB: 07-13-1960    This 55 y.o. male presents to the clinic today for follow-up patient stage IV small cell lung cancer previous he treated with palliative radiation therapy.  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 He is currently undergoing continuation of his chemotherapy in [place]. He had a repeat PET CT scan which shows progression of disease in his liver some further hypermetabolic activity in his bone consistent with known bone metastasis. He has been having some intermittent pain in his neck and his asked me to evaluate that today. I have requested the films from his recent PET CT scan for my own personal review. He specifically denies any loss of strength or sensory level in his upper extremities.. He also just recently completed palliative radiation therapy to her thoracic spine as well as his right hilar lesion.  COMPLICATIONS OF TREATMENT: none  FOLLOW UP COMPLIANCE: keeps appointments   PHYSICAL EXAM:  BP (!) 130/91   Pulse 81   Temp 98 F (36.7 C)   Wt 211 lb 12 oz (96.1 kg)   BMI 29.95 kg/m  Motor and sensory levels are equal symmetric in upper lower extremities. Proprioception is intact. No pain is elicited on rotation or range of motion of his neck. Well-developed well-nourished patient in NAD. HEENT reveals PERLA, EOMI, discs not visualized.  Oral cavity is clear. No oral mucosal lesions are identified. Neck is clear without evidence of cervical or supraclavicular adenopathy. Lungs are clear to A&P. Cardiac examination is essentially unremarkable with regular rate and rhythm without murmur rub or thrill. Abdomen is benign with no organomegaly or masses noted. Motor sensory and DTR levels are equal and symmetric in the upper and lower extremities. Cranial nerves II  through XII are grossly intact. Proprioception is intact. No peripheral adenopathy or edema is identified. No motor or sensory levels are noted. Crude visual fields are within normal range.  RADIOLOGY RESULTS: PET CT scan report is reviewed I have requested the films for my own review and compared to his previous PET CT scan  PLAN: Present time I'll obtain his recent PET CT scan for my review do not necessarily see a role at this time for palliative radiation therapy to his cervical spine unless he continues to have significant pain or any evidence of sensory or motor level. Patient is comfortable continue to observe at this time and will continue with his chemotherapy as directed. Will discuss his findings personally with the patient when I receive them.  I would like to take this opportunity to thank you for allowing me to participate in the care of your patient.Armstead Peaks., MD

## 2015-11-23 ENCOUNTER — Other Ambulatory Visit: Payer: Self-pay | Admitting: Hematology and Oncology

## 2015-11-23 ENCOUNTER — Inpatient Hospital Stay
Admission: RE | Admit: 2015-11-23 | Discharge: 2015-11-23 | Disposition: A | Discharge status: Home / Self Care | Payer: Self-pay | Source: Ambulatory Visit | Attending: Hematology and Oncology | Admitting: Hematology and Oncology

## 2015-11-23 DIAGNOSIS — C3490 Malignant neoplasm of unspecified part of unspecified bronchus or lung: Secondary | ICD-10-CM

## 2015-11-29 ENCOUNTER — Ambulatory Visit
Admission: RE | Admit: 2015-11-29 | Discharge: 2015-11-29 | Disposition: A | Discharge status: Home / Self Care | Payer: Medicaid Other | Source: Ambulatory Visit | Attending: Radiation Oncology | Admitting: Radiation Oncology

## 2015-11-29 DIAGNOSIS — C3490 Malignant neoplasm of unspecified part of unspecified bronchus or lung: Secondary | ICD-10-CM | POA: Diagnosis not present

## 2015-11-30 DIAGNOSIS — C3490 Malignant neoplasm of unspecified part of unspecified bronchus or lung: Secondary | ICD-10-CM | POA: Diagnosis not present

## 2015-12-01 ENCOUNTER — Ambulatory Visit
Admission: RE | Admit: 2015-12-01 | Discharge: 2015-12-01 | Disposition: A | Discharge status: Home / Self Care | Payer: Medicaid Other | Source: Ambulatory Visit | Attending: Radiation Oncology | Admitting: Radiation Oncology

## 2015-12-01 DIAGNOSIS — C3490 Malignant neoplasm of unspecified part of unspecified bronchus or lung: Secondary | ICD-10-CM | POA: Diagnosis not present

## 2015-12-02 ENCOUNTER — Ambulatory Visit
Admission: RE | Admit: 2015-12-02 | Discharge: 2015-12-02 | Disposition: A | Discharge status: Home / Self Care | Payer: Medicaid Other | Source: Ambulatory Visit | Attending: Radiation Oncology | Admitting: Radiation Oncology

## 2015-12-02 DIAGNOSIS — C3490 Malignant neoplasm of unspecified part of unspecified bronchus or lung: Secondary | ICD-10-CM | POA: Diagnosis not present

## 2015-12-03 ENCOUNTER — Ambulatory Visit
Admission: RE | Admit: 2015-12-03 | Discharge: 2015-12-03 | Disposition: A | Discharge status: Home / Self Care | Payer: Medicaid Other | Source: Ambulatory Visit | Attending: Radiation Oncology | Admitting: Radiation Oncology

## 2015-12-03 DIAGNOSIS — C3490 Malignant neoplasm of unspecified part of unspecified bronchus or lung: Secondary | ICD-10-CM | POA: Diagnosis not present

## 2015-12-07 ENCOUNTER — Ambulatory Visit
Admission: RE | Admit: 2015-12-07 | Discharge: 2015-12-07 | Disposition: A | Discharge status: Home / Self Care | Payer: Medicaid Other | Source: Ambulatory Visit | Attending: Radiation Oncology | Admitting: Radiation Oncology

## 2015-12-07 ENCOUNTER — Other Ambulatory Visit: Payer: Self-pay | Admitting: *Deleted

## 2015-12-07 DIAGNOSIS — C3491 Malignant neoplasm of unspecified part of right bronchus or lung: Secondary | ICD-10-CM

## 2015-12-07 DIAGNOSIS — C7800 Secondary malignant neoplasm of unspecified lung: Secondary | ICD-10-CM

## 2015-12-07 DIAGNOSIS — C3490 Malignant neoplasm of unspecified part of unspecified bronchus or lung: Secondary | ICD-10-CM | POA: Diagnosis not present

## 2015-12-08 ENCOUNTER — Ambulatory Visit
Admission: RE | Admit: 2015-12-08 | Discharge: 2015-12-08 | Disposition: A | Discharge status: Home / Self Care | Payer: Medicaid Other | Source: Ambulatory Visit | Attending: Radiation Oncology | Admitting: Radiation Oncology

## 2015-12-08 ENCOUNTER — Other Ambulatory Visit: Payer: Self-pay | Admitting: *Deleted

## 2015-12-08 ENCOUNTER — Other Ambulatory Visit: Payer: Self-pay | Admitting: Radiation Oncology

## 2015-12-08 DIAGNOSIS — C3490 Malignant neoplasm of unspecified part of unspecified bronchus or lung: Secondary | ICD-10-CM | POA: Diagnosis not present

## 2015-12-08 DIAGNOSIS — C3491 Malignant neoplasm of unspecified part of right bronchus or lung: Secondary | ICD-10-CM

## 2015-12-08 DIAGNOSIS — M898X8 Other specified disorders of bone, other site: Secondary | ICD-10-CM | POA: Diagnosis not present

## 2015-12-08 DIAGNOSIS — C7800 Secondary malignant neoplasm of unspecified lung: Secondary | ICD-10-CM | POA: Diagnosis not present

## 2015-12-09 ENCOUNTER — Ambulatory Visit
Admission: RE | Admit: 2015-12-09 | Discharge: 2015-12-09 | Disposition: A | Discharge status: Home / Self Care | Payer: Medicaid Other | Source: Ambulatory Visit | Attending: Radiation Oncology | Admitting: Radiation Oncology

## 2015-12-09 DIAGNOSIS — C3490 Malignant neoplasm of unspecified part of unspecified bronchus or lung: Secondary | ICD-10-CM | POA: Diagnosis not present

## 2015-12-10 ENCOUNTER — Ambulatory Visit
Admission: RE | Admit: 2015-12-10 | Discharge: 2015-12-10 | Disposition: A | Discharge status: Home / Self Care | Payer: Medicaid Other | Source: Ambulatory Visit | Attending: Radiation Oncology | Admitting: Radiation Oncology

## 2015-12-10 DIAGNOSIS — C3490 Malignant neoplasm of unspecified part of unspecified bronchus or lung: Secondary | ICD-10-CM | POA: Diagnosis not present

## 2015-12-13 ENCOUNTER — Ambulatory Visit
Admission: RE | Admit: 2015-12-13 | Discharge: 2015-12-13 | Disposition: A | Discharge status: Home / Self Care | Payer: Medicaid Other | Source: Ambulatory Visit | Attending: Radiation Oncology | Admitting: Radiation Oncology

## 2015-12-13 DIAGNOSIS — C3490 Malignant neoplasm of unspecified part of unspecified bronchus or lung: Secondary | ICD-10-CM | POA: Diagnosis not present

## 2015-12-14 ENCOUNTER — Ambulatory Visit
Admission: RE | Admit: 2015-12-14 | Discharge: 2015-12-14 | Disposition: A | Discharge status: Home / Self Care | Payer: Medicaid Other | Source: Ambulatory Visit | Attending: Radiation Oncology | Admitting: Radiation Oncology

## 2015-12-14 DIAGNOSIS — C3490 Malignant neoplasm of unspecified part of unspecified bronchus or lung: Secondary | ICD-10-CM | POA: Diagnosis not present

## 2015-12-15 ENCOUNTER — Ambulatory Visit
Admission: RE | Admit: 2015-12-15 | Discharge: 2015-12-15 | Disposition: A | Discharge status: Home / Self Care | Payer: Medicaid Other | Source: Ambulatory Visit | Attending: Radiation Oncology | Admitting: Radiation Oncology

## 2015-12-15 DIAGNOSIS — C3490 Malignant neoplasm of unspecified part of unspecified bronchus or lung: Secondary | ICD-10-CM | POA: Diagnosis not present

## 2015-12-16 ENCOUNTER — Ambulatory Visit
Admission: RE | Admit: 2015-12-16 | Discharge: 2015-12-16 | Disposition: A | Discharge status: Home / Self Care | Payer: Medicaid Other | Source: Ambulatory Visit | Attending: Radiation Oncology | Admitting: Radiation Oncology

## 2015-12-16 ENCOUNTER — Ambulatory Visit: Payer: Self-pay | Admitting: Radiation Oncology

## 2015-12-16 DIAGNOSIS — C3490 Malignant neoplasm of unspecified part of unspecified bronchus or lung: Secondary | ICD-10-CM | POA: Diagnosis not present

## 2016-01-19 ENCOUNTER — Ambulatory Visit: Payer: Medicaid Other | Admitting: Radiation Oncology

## 2016-02-07 ENCOUNTER — Ambulatory Visit
Admission: RE | Admit: 2016-02-07 | Discharge: 2016-02-07 | Disposition: A | Discharge status: Home / Self Care | Payer: Medicaid Other | Source: Ambulatory Visit | Attending: Pulmonary Disease | Admitting: Pulmonary Disease

## 2016-02-07 ENCOUNTER — Ambulatory Visit (INDEPENDENT_AMBULATORY_CARE_PROVIDER_SITE_OTHER): Payer: Medicaid Other | Admitting: Pulmonary Disease

## 2016-02-07 ENCOUNTER — Encounter: Payer: Self-pay | Admitting: Pulmonary Disease

## 2016-02-07 VITALS — BP 130/84 | HR 79 | Ht 71.0 in | Wt 203.0 lb

## 2016-02-07 DIAGNOSIS — R05 Cough: Secondary | ICD-10-CM

## 2016-02-07 DIAGNOSIS — C3490 Malignant neoplasm of unspecified part of unspecified bronchus or lung: Secondary | ICD-10-CM

## 2016-02-07 DIAGNOSIS — J90 Pleural effusion, not elsewhere classified: Secondary | ICD-10-CM | POA: Insufficient documentation

## 2016-02-07 DIAGNOSIS — J449 Chronic obstructive pulmonary disease, unspecified: Secondary | ICD-10-CM

## 2016-02-07 DIAGNOSIS — R059 Cough, unspecified: Secondary | ICD-10-CM

## 2016-02-07 MED ORDER — ALBUTEROL SULFATE HFA 108 (90 BASE) MCG/ACT IN AERS: 2.0000 | INHALATION_SPRAY | Freq: Four times a day (QID) | RESPIRATORY_TRACT | 6 refills | Status: AC | PRN

## 2016-02-07 NOTE — Patient Instructions (Signed)
1) Proventil inhaler - 2 sprays as needed.  2) Chest Xray today

## 2016-02-10 NOTE — Progress Notes (Signed)
PROBLEMS: Small cell cancer of lung, extensive stage Smoker Wheezing   INTERVAL: Undergoing radiation and chemotherapy @ [facility]  SUBJ: No new complaints. Pleuritic CP is improved. Energy level is good. Still smokes 1/2 PPD. Has minimal cough with scant mucus. Denies CP, fever, purulent sputum, hemoptysis, LE edema and calf tenderness.   OBJ: Vitals:   02/07/16 1118  BP: 130/84  Pulse: 79  SpO2: 98%  Weight: 203 lb (92.1 kg)  Height: '5\' 11"'$  (1.803 m)   NAD  HEENT WNL No JVD Moderately diminished BS, no wheezes RRR s M NABS, soft No C/C/E No focal neuro deficits   DATA: No new labs or CXR  IMPRESSION: Extensive small cell cancer Smoker  Chronic bronchitis with intermittent wheezing  He seems in better spirits. No new major issues. Struggling with smoking cessation. He does not have a rescue inhaler  PLAN: Again counseled re: smoking cessation Albuterol MDI 2 inhalations PRN CXR ordered this visit - Reviewed: small to moderate L pleural effusion ROV 3 months or PRN  Merton Border, MD PCCM service Mobile [phone] Pager [phone] 02/10/2016

## 2016-02-11 ENCOUNTER — Telehealth: Payer: Self-pay | Admitting: Pulmonary Disease

## 2016-02-11 NOTE — Telephone Encounter (Signed)
Pt requesting CXR results from 02-07-16 and would like to know comparison to last CXR taken.  DS please advise. Thanks.

## 2016-02-11 NOTE — Telephone Encounter (Signed)
Pt would like xray results. Please call.

## 2016-05-22 ENCOUNTER — Other Ambulatory Visit: Payer: Self-pay | Admitting: Hematology and Oncology

## 2016-05-22 ENCOUNTER — Ambulatory Visit
Admission: RE | Admit: 2016-05-22 | Discharge: 2016-05-22 | Disposition: A | Discharge status: Home / Self Care | Payer: Self-pay | Source: Ambulatory Visit | Attending: Hematology and Oncology | Admitting: Hematology and Oncology

## 2016-05-22 DIAGNOSIS — C414 Malignant neoplasm of pelvic bones, sacrum and coccyx: Secondary | ICD-10-CM

## 2016-05-23 ENCOUNTER — Other Ambulatory Visit: Payer: Self-pay | Admitting: Hematology and Oncology

## 2016-05-23 ENCOUNTER — Ambulatory Visit
Admission: RE | Admit: 2016-05-23 | Discharge: 2016-05-23 | Disposition: A | Discharge status: Home / Self Care | Payer: Self-pay | Source: Ambulatory Visit | Attending: Hematology and Oncology | Admitting: Hematology and Oncology

## 2016-05-23 DIAGNOSIS — R918 Other nonspecific abnormal finding of lung field: Secondary | ICD-10-CM

## 2016-05-25 ENCOUNTER — Other Ambulatory Visit: Payer: Self-pay | Admitting: *Deleted

## 2016-05-25 ENCOUNTER — Ambulatory Visit
Admission: RE | Admit: 2016-05-25 | Discharge: 2016-05-25 | Disposition: A | Discharge status: Home / Self Care | Payer: Medicaid Other | Source: Ambulatory Visit | Attending: Radiation Oncology | Admitting: Radiation Oncology

## 2016-05-25 ENCOUNTER — Encounter: Payer: Self-pay | Admitting: Radiation Oncology

## 2016-05-25 VITALS — BP 128/87 | HR 96 | Temp 98.7°F | Resp 20 | Wt 216.9 lb

## 2016-05-25 DIAGNOSIS — Z510 Encounter for antineoplastic radiation therapy: Secondary | ICD-10-CM | POA: Diagnosis present

## 2016-05-25 DIAGNOSIS — C3411 Malignant neoplasm of upper lobe, right bronchus or lung: Secondary | ICD-10-CM | POA: Insufficient documentation

## 2016-05-25 DIAGNOSIS — C7951 Secondary malignant neoplasm of bone: Secondary | ICD-10-CM | POA: Diagnosis not present

## 2016-05-25 DIAGNOSIS — Z923 Personal history of irradiation: Secondary | ICD-10-CM | POA: Diagnosis not present

## 2016-05-25 DIAGNOSIS — F17210 Nicotine dependence, cigarettes, uncomplicated: Secondary | ICD-10-CM | POA: Diagnosis not present

## 2016-05-25 DIAGNOSIS — C787 Secondary malignant neoplasm of liver and intrahepatic bile duct: Secondary | ICD-10-CM | POA: Diagnosis not present

## 2016-05-25 DIAGNOSIS — C3491 Malignant neoplasm of unspecified part of right bronchus or lung: Secondary | ICD-10-CM

## 2016-05-25 DIAGNOSIS — C7931 Secondary malignant neoplasm of brain: Secondary | ICD-10-CM | POA: Diagnosis not present

## 2016-05-25 MED ORDER — DEXAMETHASONE 4 MG PO TABS: 4.0000 mg | ORAL_TABLET | Freq: Every day | ORAL | 0 refills | Status: DC

## 2016-05-25 NOTE — Progress Notes (Signed)
Radiation Oncology      old patient new area of brain metastasis Follow up Note  Name: Chris Mason   Date:   05/25/2016 MRN:  [id number] DOB: [birth year]    This 56 y.o. male presents to the clinic today for 56 year old male with known stage IV small cell lung cancer now with progressive disease in his cerebellum on immunotherapy.  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 He has been doing well currently under immunotherapy with nivolumab which has shown good response in both his liver and lung. Unfortunately recent MRI of his brain shows interval increase in rim-enhancing lesion of the right posterior superior cerebellum no measuring 2.5 cm in greatest dimension with surrounding edema. He's been seen at [facility] and recognition for whole brain radiation therapy was made. He is seen today for consideration of treatment closer to home. States he is having some slight headaches no change in visual fields any motor or sensory levels or balance issues.  COMPLICATIONS OF TREATMENT: none  FOLLOW UP COMPLIANCE: keeps appointments   PHYSICAL EXAM:  There were no vitals taken for this visit. Well-developed well-nourished patient in NAD. HEENT reveals PERLA, EOMI, discs not visualized.  Oral cavity is clear. No oral mucosal lesions are identified. Neck is clear without evidence of cervical or supraclavicular adenopathy. Lungs are clear to A&P. Cardiac examination is essentially unremarkable with regular rate and rhythm without murmur rub or thrill. Abdomen is benign with no organomegaly or masses noted. Motor sensory and DTR levels are equal and symmetric in the upper and lower extremities. Cranial nerves II through XII are grossly intact. Proprioception is intact. No peripheral adenopathy or edema is identified. No motor or  sensory levels are noted. Crude visual fields are within normal range.  RADIOLOGY RESULTS: MRI of brain from [place] is reviewed and compatible with the above-stated findings  PLAN: At this time like to go ahead with whole brain radiation therapy. Would plan on delivering 3000 cGy in 12 fractions boosting his cerebellum with IM RT treatment to 2000 cGy in 4 fractions. Risks and benefits of treatment including hair loss fatigue alteration of blood counts and possible cerebral edema all were discussed in detail am starting the patient 4 mg of Decadron in the mornings. I have personally simulated him today and will start treatments next week. He'll be on break from immunotherapy during his radiation therapy treatments.  I would like to take this opportunity to thank you for allowing me to participate in the care of your patient.Armstead Peaks., MD

## 2016-05-30 DIAGNOSIS — Z510 Encounter for antineoplastic radiation therapy: Secondary | ICD-10-CM | POA: Diagnosis not present

## 2016-06-02 ENCOUNTER — Other Ambulatory Visit: Payer: Self-pay | Admitting: *Deleted

## 2016-06-02 DIAGNOSIS — C7931 Secondary malignant neoplasm of brain: Secondary | ICD-10-CM

## 2016-06-05 ENCOUNTER — Ambulatory Visit
Admission: RE | Admit: 2016-06-05 | Discharge: 2016-06-05 | Disposition: A | Discharge status: Home / Self Care | Payer: Medicaid Other | Source: Ambulatory Visit | Attending: Radiation Oncology | Admitting: Radiation Oncology

## 2016-06-05 DIAGNOSIS — Z510 Encounter for antineoplastic radiation therapy: Secondary | ICD-10-CM | POA: Diagnosis not present

## 2016-06-06 ENCOUNTER — Ambulatory Visit
Admission: RE | Admit: 2016-06-06 | Discharge: 2016-06-06 | Disposition: A | Discharge status: Home / Self Care | Payer: Medicaid Other | Source: Ambulatory Visit | Attending: Radiation Oncology | Admitting: Radiation Oncology

## 2016-06-06 ENCOUNTER — Other Ambulatory Visit: Payer: Self-pay | Admitting: *Deleted

## 2016-06-06 DIAGNOSIS — Z510 Encounter for antineoplastic radiation therapy: Secondary | ICD-10-CM | POA: Diagnosis not present

## 2016-06-06 MED ORDER — AZITHROMYCIN 250 MG PO TABS: ORAL_TABLET | ORAL | 0 refills | Status: DC

## 2016-06-07 ENCOUNTER — Other Ambulatory Visit: Payer: Self-pay | Admitting: *Deleted

## 2016-06-07 ENCOUNTER — Ambulatory Visit
Admission: RE | Admit: 2016-06-07 | Discharge: 2016-06-07 | Disposition: A | Discharge status: Home / Self Care | Payer: Medicaid Other | Source: Ambulatory Visit | Attending: Radiation Oncology | Admitting: Radiation Oncology

## 2016-06-07 DIAGNOSIS — Z510 Encounter for antineoplastic radiation therapy: Secondary | ICD-10-CM | POA: Diagnosis not present

## 2016-06-07 MED ORDER — DEXAMETHASONE 4 MG PO TABS: 4.0000 mg | ORAL_TABLET | Freq: Every day | ORAL | 0 refills | Status: DC

## 2016-06-07 MED ORDER — ALPRAZOLAM 0.5 MG PO TABS: 0.5000 mg | ORAL_TABLET | Freq: Every evening | ORAL | 0 refills | Status: DC | PRN

## 2016-06-08 ENCOUNTER — Ambulatory Visit
Admission: RE | Admit: 2016-06-08 | Discharge: 2016-06-08 | Disposition: A | Discharge status: Home / Self Care | Payer: Medicaid Other | Source: Ambulatory Visit | Attending: Radiation Oncology | Admitting: Radiation Oncology

## 2016-06-08 DIAGNOSIS — Z510 Encounter for antineoplastic radiation therapy: Secondary | ICD-10-CM | POA: Diagnosis not present

## 2016-06-09 ENCOUNTER — Ambulatory Visit
Admission: RE | Admit: 2016-06-09 | Discharge: 2016-06-09 | Disposition: A | Discharge status: Home / Self Care | Payer: Medicaid Other | Source: Ambulatory Visit | Attending: Radiation Oncology | Admitting: Radiation Oncology

## 2016-06-09 DIAGNOSIS — Z510 Encounter for antineoplastic radiation therapy: Secondary | ICD-10-CM | POA: Diagnosis not present

## 2016-06-12 ENCOUNTER — Ambulatory Visit
Admission: RE | Admit: 2016-06-12 | Discharge: 2016-06-12 | Disposition: A | Discharge status: Home / Self Care | Payer: Medicaid Other | Source: Ambulatory Visit | Attending: Radiation Oncology | Admitting: Radiation Oncology

## 2016-06-12 DIAGNOSIS — Z510 Encounter for antineoplastic radiation therapy: Secondary | ICD-10-CM | POA: Diagnosis not present

## 2016-06-13 ENCOUNTER — Inpatient Hospital Stay: Payer: Medicaid Other | Attending: Radiation Oncology

## 2016-06-13 ENCOUNTER — Ambulatory Visit
Admission: RE | Admit: 2016-06-13 | Discharge: 2016-06-13 | Disposition: A | Discharge status: Home / Self Care | Payer: Medicaid Other | Source: Ambulatory Visit | Attending: Radiation Oncology | Admitting: Radiation Oncology

## 2016-06-13 DIAGNOSIS — Z510 Encounter for antineoplastic radiation therapy: Secondary | ICD-10-CM | POA: Diagnosis not present

## 2016-06-13 DIAGNOSIS — C7931 Secondary malignant neoplasm of brain: Secondary | ICD-10-CM

## 2016-06-13 DIAGNOSIS — C3481 Malignant neoplasm of overlapping sites of right bronchus and lung: Secondary | ICD-10-CM | POA: Diagnosis present

## 2016-06-13 LAB — CBC
HCT: 49 % (ref 40.0–52.0)
Hemoglobin: 16.8 g/dL (ref 13.0–18.0)
MCH: 28.7 pg (ref 26.0–34.0)
MCHC: 34.2 g/dL (ref 32.0–36.0)
MCV: 83.8 fL (ref 80.0–100.0)
Platelets: 282 10*3/uL (ref 150–440)
RBC: 5.85 MIL/uL (ref 4.40–5.90)
RDW: 15.9 % — AB (ref 11.5–14.5)
WBC: 15.4 10*3/uL — ABNORMAL HIGH (ref 3.8–10.6)

## 2016-06-14 ENCOUNTER — Ambulatory Visit
Admission: RE | Admit: 2016-06-14 | Discharge: 2016-06-14 | Disposition: A | Discharge status: Home / Self Care | Payer: Medicaid Other | Source: Ambulatory Visit | Attending: Radiation Oncology | Admitting: Radiation Oncology

## 2016-06-14 ENCOUNTER — Other Ambulatory Visit: Payer: Self-pay | Admitting: *Deleted

## 2016-06-14 DIAGNOSIS — Z510 Encounter for antineoplastic radiation therapy: Secondary | ICD-10-CM | POA: Diagnosis not present

## 2016-06-14 MED ORDER — OXYCODONE HCL 10 MG PO TABS: 10.0000 mg | ORAL_TABLET | Freq: Four times a day (QID) | ORAL | 0 refills | Status: AC | PRN

## 2016-06-15 ENCOUNTER — Ambulatory Visit
Admission: RE | Admit: 2016-06-15 | Discharge: 2016-06-15 | Disposition: A | Discharge status: Home / Self Care | Payer: Medicaid Other | Source: Ambulatory Visit | Attending: Radiation Oncology | Admitting: Radiation Oncology

## 2016-06-15 ENCOUNTER — Other Ambulatory Visit: Payer: Self-pay | Admitting: *Deleted

## 2016-06-15 DIAGNOSIS — Z510 Encounter for antineoplastic radiation therapy: Secondary | ICD-10-CM | POA: Diagnosis not present

## 2016-06-15 MED ORDER — LANSOPRAZOLE 30 MG PO CPDR: 30.0000 mg | DELAYED_RELEASE_CAPSULE | Freq: Every day | ORAL | 3 refills | Status: DC

## 2016-06-15 MED ORDER — OXYCODONE HCL 15 MG PO TABS: 15.0000 mg | ORAL_TABLET | Freq: Four times a day (QID) | ORAL | 0 refills | Status: DC | PRN

## 2016-06-16 ENCOUNTER — Ambulatory Visit: Payer: Medicaid Other

## 2016-06-19 ENCOUNTER — Other Ambulatory Visit: Payer: Self-pay | Admitting: *Deleted

## 2016-06-19 ENCOUNTER — Ambulatory Visit
Admission: RE | Admit: 2016-06-19 | Discharge: 2016-06-19 | Disposition: A | Discharge status: Home / Self Care | Payer: Medicaid Other | Source: Ambulatory Visit | Attending: Radiation Oncology | Admitting: Radiation Oncology

## 2016-06-19 DIAGNOSIS — Z510 Encounter for antineoplastic radiation therapy: Secondary | ICD-10-CM | POA: Diagnosis not present

## 2016-06-20 ENCOUNTER — Inpatient Hospital Stay: Payer: Medicaid Other

## 2016-06-20 ENCOUNTER — Other Ambulatory Visit: Payer: Self-pay | Admitting: *Deleted

## 2016-06-20 ENCOUNTER — Ambulatory Visit
Admission: RE | Admit: 2016-06-20 | Discharge: 2016-06-20 | Disposition: A | Discharge status: Home / Self Care | Payer: Medicaid Other | Source: Ambulatory Visit | Attending: Radiation Oncology | Admitting: Radiation Oncology

## 2016-06-20 DIAGNOSIS — C3481 Malignant neoplasm of overlapping sites of right bronchus and lung: Secondary | ICD-10-CM | POA: Diagnosis not present

## 2016-06-20 DIAGNOSIS — Z510 Encounter for antineoplastic radiation therapy: Secondary | ICD-10-CM | POA: Diagnosis not present

## 2016-06-20 DIAGNOSIS — C7931 Secondary malignant neoplasm of brain: Secondary | ICD-10-CM

## 2016-06-20 LAB — CBC
HCT: 47 % (ref 40.0–52.0)
HEMOGLOBIN: 15.8 g/dL (ref 13.0–18.0)
MCH: 29.2 pg (ref 26.0–34.0)
MCHC: 33.7 g/dL (ref 32.0–36.0)
MCV: 86.8 fL (ref 80.0–100.0)
PLATELETS: 185 10*3/uL (ref 150–440)
RBC: 5.41 MIL/uL (ref 4.40–5.90)
RDW: 16.2 % — ABNORMAL HIGH (ref 11.5–14.5)
WBC: 14.6 10*3/uL — ABNORMAL HIGH (ref 3.8–10.6)

## 2016-06-20 MED ORDER — FLUCONAZOLE 100 MG PO TABS: 100.0000 mg | ORAL_TABLET | Freq: Every day | ORAL | 0 refills | Status: DC

## 2016-06-21 ENCOUNTER — Ambulatory Visit: Payer: Medicaid Other

## 2016-06-22 ENCOUNTER — Ambulatory Visit
Admission: RE | Admit: 2016-06-22 | Discharge: 2016-06-22 | Disposition: A | Discharge status: Home / Self Care | Payer: Medicaid Other | Source: Ambulatory Visit | Attending: Radiation Oncology | Admitting: Radiation Oncology

## 2016-06-22 DIAGNOSIS — Z510 Encounter for antineoplastic radiation therapy: Secondary | ICD-10-CM | POA: Diagnosis not present

## 2016-06-23 ENCOUNTER — Ambulatory Visit: Payer: Medicaid Other

## 2016-06-26 ENCOUNTER — Ambulatory Visit: Admission: RE | Admit: 2016-06-26 | Payer: Medicaid Other | Source: Ambulatory Visit

## 2016-06-26 ENCOUNTER — Ambulatory Visit
Admission: RE | Admit: 2016-06-26 | Discharge: 2016-06-26 | Disposition: A | Discharge status: Home / Self Care | Payer: Medicaid Other | Source: Ambulatory Visit | Attending: Radiation Oncology | Admitting: Radiation Oncology

## 2016-06-26 DIAGNOSIS — Z510 Encounter for antineoplastic radiation therapy: Secondary | ICD-10-CM | POA: Diagnosis not present

## 2016-06-27 ENCOUNTER — Ambulatory Visit: Payer: Medicaid Other

## 2016-06-27 ENCOUNTER — Other Ambulatory Visit: Payer: Self-pay | Admitting: *Deleted

## 2016-06-27 ENCOUNTER — Ambulatory Visit
Admission: RE | Admit: 2016-06-27 | Discharge: 2016-06-27 | Disposition: A | Discharge status: Home / Self Care | Payer: Medicaid Other | Source: Ambulatory Visit | Attending: Radiation Oncology | Admitting: Radiation Oncology

## 2016-06-27 DIAGNOSIS — Z510 Encounter for antineoplastic radiation therapy: Secondary | ICD-10-CM | POA: Diagnosis not present

## 2016-06-27 MED ORDER — ALPRAZOLAM 0.5 MG PO TABS: 0.5000 mg | ORAL_TABLET | Freq: Every evening | ORAL | 0 refills | Status: DC | PRN

## 2016-06-28 ENCOUNTER — Ambulatory Visit: Payer: Medicaid Other

## 2016-06-28 ENCOUNTER — Ambulatory Visit
Admission: RE | Admit: 2016-06-28 | Discharge: 2016-06-28 | Disposition: A | Discharge status: Home / Self Care | Payer: Medicaid Other | Source: Ambulatory Visit | Attending: Radiation Oncology | Admitting: Radiation Oncology

## 2016-06-28 DIAGNOSIS — Z510 Encounter for antineoplastic radiation therapy: Secondary | ICD-10-CM | POA: Diagnosis not present

## 2016-06-29 ENCOUNTER — Other Ambulatory Visit: Payer: Self-pay | Admitting: *Deleted

## 2016-06-29 ENCOUNTER — Ambulatory Visit: Payer: Medicaid Other

## 2016-06-29 ENCOUNTER — Ambulatory Visit
Admission: RE | Admit: 2016-06-29 | Discharge: 2016-06-29 | Disposition: A | Discharge status: Home / Self Care | Payer: Medicaid Other | Source: Ambulatory Visit | Attending: Radiation Oncology | Admitting: Radiation Oncology

## 2016-06-29 DIAGNOSIS — Z510 Encounter for antineoplastic radiation therapy: Secondary | ICD-10-CM | POA: Diagnosis not present

## 2016-06-29 MED ORDER — FLUCONAZOLE 100 MG PO TABS: 100.0000 mg | ORAL_TABLET | Freq: Every day | ORAL | 0 refills | Status: DC

## 2016-06-30 ENCOUNTER — Ambulatory Visit: Payer: Medicaid Other

## 2016-07-03 ENCOUNTER — Ambulatory Visit
Admission: RE | Admit: 2016-07-03 | Discharge: 2016-07-03 | Disposition: A | Discharge status: Home / Self Care | Payer: Medicaid Other | Source: Ambulatory Visit | Attending: Radiation Oncology | Admitting: Radiation Oncology

## 2016-07-03 DIAGNOSIS — Z510 Encounter for antineoplastic radiation therapy: Secondary | ICD-10-CM | POA: Diagnosis not present

## 2016-07-31 ENCOUNTER — Ambulatory Visit
Admission: RE | Admit: 2016-07-31 | Discharge: 2016-07-31 | Disposition: A | Discharge status: Home / Self Care | Payer: Medicaid Other | Source: Ambulatory Visit | Attending: Radiation Oncology | Admitting: Radiation Oncology

## 2016-07-31 ENCOUNTER — Encounter: Payer: Self-pay | Admitting: Radiation Oncology

## 2016-07-31 VITALS — BP 131/88 | HR 99 | Temp 97.4°F | Wt 201.9 lb

## 2016-07-31 DIAGNOSIS — C7931 Secondary malignant neoplasm of brain: Secondary | ICD-10-CM

## 2016-07-31 DIAGNOSIS — Z510 Encounter for antineoplastic radiation therapy: Secondary | ICD-10-CM | POA: Diagnosis not present

## 2016-07-31 NOTE — Progress Notes (Signed)
Radiation Oncology Follow up Note  Name: Chris Mason   Date:   07/31/2016 MRN:  [id number] DOB: 18-Aug-1960    This 56 y.o. male presents to the clinic today for one-month follow-up status post whole brain radiation for stage IV small cell lung cancer.  REFERRING PROVIDER: [facility]*  HPI: Patient is a 56 year old male now out 1 month having completed whole brain radiation plus boost for metastatic involvement of his brain from stage IV small cell lung cancer. He also was known liver and bone metastasis. He is seen today in routine follow-up is doing well. Specifically denies any focal neurologic deficits change in visual fields or headaches. He has been having some trouble with blisters in his years is currently on eardrops for that from ENT.Marland Kitchen He is currently receiving immunotherapy withnivolumab (at [facility] and tolerating that well.  COMPLICATIONS OF TREATMENT: none  FOLLOW UP COMPLIANCE: keeps appointments   PHYSICAL EXAM:  BP 131/88   Pulse 99   Temp 97.4 F (36.3 C)   Wt 201 lb 15.1 oz (91.6 kg)   BMI 28.17 kg/m  Well-developed well-nourished patient in NAD. HEENT reveals PERLA, EOMI, discs not visualized.  Oral cavity is clear. No oral mucosal lesions are identified. Neck is clear without evidence of cervical or supraclavicular adenopathy. Lungs are clear to A&P. Cardiac examination is essentially unremarkable with regular rate and rhythm without murmur rub or thrill. Abdomen is benign with no organomegaly or masses noted. Motor sensory and DTR levels are equal and symmetric in the upper and lower extremities. Cranial nerves II through XII are grossly intact. Proprioception is intact. No peripheral adenopathy or edema is identified. No motor or sensory levels are noted. Crude visual fields are within normal range.  RADIOLOGY RESULTS: No current films for review  PLAN: Present time he is recovering well from his whole brain radiation without significant side  effects or complaints. He continues immunotherapy treatments at [facility]. I have asked to see him back in 6 months for follow-up. I've also instructed the patient to contact me at any time should further palliative radiation therapy be indicated.  I would like to take this opportunity to thank you for allowing me to participate in the care of your patient.Armstead Peaks., MD

## 2016-10-03 NOTE — Telephone Encounter (Signed)
done

## 2017-02-08 ENCOUNTER — Ambulatory Visit: Payer: Medicaid Other | Admitting: Radiation Oncology

## 2017-03-02 ENCOUNTER — Other Ambulatory Visit: Payer: Self-pay | Admitting: Internal Medicine

## 2017-03-02 DIAGNOSIS — M25511 Pain in right shoulder: Secondary | ICD-10-CM

## 2017-03-12 ENCOUNTER — Ambulatory Visit: Admission: RE | Admit: 2017-03-12 | Payer: Medicaid Other | Source: Ambulatory Visit

## 2017-03-20 ENCOUNTER — Ambulatory Visit: Admission: RE | Admit: 2017-03-20 | Payer: Medicaid Other | Source: Ambulatory Visit

## 2017-04-05 ENCOUNTER — Ambulatory Visit: Payer: Medicaid Other

## 2017-08-20 ENCOUNTER — Encounter: Payer: Self-pay | Admitting: Pulmonary Disease

## 2017-08-20 ENCOUNTER — Ambulatory Visit
Admission: RE | Admit: 2017-08-20 | Discharge: 2017-08-20 | Disposition: A | Discharge status: Home / Self Care | Payer: Medicaid Other | Source: Ambulatory Visit | Attending: Pulmonary Disease | Admitting: Pulmonary Disease

## 2017-08-20 ENCOUNTER — Ambulatory Visit: Payer: Medicaid Other | Admitting: Pulmonary Disease

## 2017-08-20 VITALS — BP 110/80 | HR 85 | Ht 71.0 in | Wt 190.0 lb

## 2017-08-20 DIAGNOSIS — J449 Chronic obstructive pulmonary disease, unspecified: Secondary | ICD-10-CM

## 2017-08-20 DIAGNOSIS — C3490 Malignant neoplasm of unspecified part of unspecified bronchus or lung: Secondary | ICD-10-CM | POA: Diagnosis not present

## 2017-08-20 DIAGNOSIS — R918 Other nonspecific abnormal finding of lung field: Secondary | ICD-10-CM

## 2017-08-20 DIAGNOSIS — R05 Cough: Secondary | ICD-10-CM | POA: Diagnosis present

## 2017-08-20 DIAGNOSIS — J189 Pneumonia, unspecified organism: Secondary | ICD-10-CM | POA: Diagnosis not present

## 2017-08-20 DIAGNOSIS — F17200 Nicotine dependence, unspecified, uncomplicated: Secondary | ICD-10-CM

## 2017-08-20 MED ORDER — UMECLIDINIUM-VILANTEROL 62.5-25 MCG/INH IN AEPB: 1.0000 | INHALATION_SPRAY | Freq: Every day | RESPIRATORY_TRACT | 5 refills | Status: AC

## 2017-08-20 MED ORDER — FLUTICASONE-UMECLIDIN-VILANT 100-62.5-25 MCG/INH IN AEPB: 1.0000 | INHALATION_SPRAY | Freq: Every day | RESPIRATORY_TRACT | 0 refills | Status: AC

## 2017-08-20 NOTE — Progress Notes (Signed)
PROBLEMS: Small cell cancer of lung, extensive stage Smoker - continues to smoke 1/2 PPD COPD   INTERVAL: Last seen 02/2016. Undergoing radiation and chemotherapy @ [facility]  SUBJ: He has done reasonably well overall with treatment for small cell carcinoma the lung.  However, he has suffered a recent recurrence with brain metastases.  There is a plan for radiation therapy to his brain.  He continues to smoke one half PPD.  He has minimal shortness of breath.  He denies purulent sputum and wheezing. Over the past week, he has had increased cough which comes in paroxysms.  This is nonproductive.  It is triggered by taking deep inspiration.  He has had no fever or chest pain.  He has just completed a course of ciprofloxacin for a lesion on the inside of his right thigh.   OBJ: Vitals:   08/20/17 1411 08/20/17 1412  BP:  110/80  Pulse:  85  SpO2:  97%  Weight: 190 lb (86.2 kg)   Height: 5\' 11"  (1.803 m)   Room air   No overt distress HEENT WNL Neck supple without lymphadenopathy, JVD Chest exam reveals mildly diminished breath sounds with right basilar crackles, no wheezes RRR, no M NABS, soft No C/C/E No focal neurologic deficits   DATA: CBC Latest Ref Rng & Units 06/20/2016 06/13/2016 11/08/2015  WBC 3.8 - 10.6 K/uL 14.6(H) 15.4(H) 8.2  Hemoglobin 13.0 - 18.0 g/dL 15.8 16.8 14.1  Hematocrit 40.0 - 52.0 % 47.0 49.0 40.9  Platelets 150 - 440 K/uL 185 282 213    BMP Latest Ref Rng & Units 10/20/2015 09/27/2015 09/06/2015  Glucose 65 - 99 mg/dL 190(H) 165(H) 156(H)  BUN 6 - 20 mg/dL 32(H) 20 22(H)  Creatinine 0.61 - 1.24 mg/dL 0.95 0.80 0.80  Sodium 135 - 145 mmol/L 133(L) 135 138  Potassium 3.5 - 5.1 mmol/L 4.0 4.0 4.1  Chloride 101 - 111 mmol/L 101 103 105  CO2 22 - 32 mmol/L 22 25 25   Calcium 8.9 - 10.3 mg/dL 9.2 9.2 9.6   CXR 08/20/17: Irregular RLL opacity  IMPRESSION: 1) recalcitrant smoker 2) metastatic small cell carcinoma of lung 3) COPD without wheezing -however, he  wishes to try bronchodilator therapy to see if it offers him any benefit in his current symptoms of shortness of breath and cough. 4) RLL opacity -the appearance is most consistent with pneumonia.  However, he is just completed a course of antibiotics and he denies symptoms of fever, chest pain, purulent sputum.  Therefore, I am more concerned that this represents either an adverse reaction to one of his chemotherapy agent or progression of cancer.  He is scheduled for restaging CT scans next week.  On the basis of the CXR findings, I spoke with his oncologist (Dr. Kinnie Scales) at which time I learned that he is scheduled for the restaging CT scans on 5/29.  Since he has left the office, I have spoken with him on the phone and informed him that I believe that this is an adequate plan.  For now, we will keep him off of antibiotics.  PLAN: CXR was ordered during this visit and reviewed when available.  The findings of the CXR discussed above  I provided a sample of Trelegy inhaler to be used 1 inhalation daily (we did not have samples of Anoro)  If he believes that the Trelegy was beneficial, I placed a prescription for Anoro (sent to his pharmacy)  I spoke with Dr. Kinnie Scales as discussed above  Follow-up in  3 months or sooner as needed  Merton Border, MD PCCM service Mobile [phone] Pager [phone] 08/20/2017 4:58 PM

## 2017-08-20 NOTE — Patient Instructions (Signed)
CXR today. I will call you with the results tomorrow I have provided a sample of Trelegy inhaler - one inhalation daily If you feel that Trelegy helped your breathing, I placed a prescription for Anoro (it is essentially the same thing as Trelegy) Follow up in 3 months or sooner as needed

## 2017-08-21 ENCOUNTER — Telehealth: Payer: Self-pay | Admitting: Pulmonary Disease

## 2017-08-21 NOTE — Telephone Encounter (Signed)
Spoke with DS and he states he spoke with pt's oncologist, Consuello Masse, and that she is going to have pt's CT moved up sooner than May 29th.   Called pt and he states that no one is telling him what is going and that we are suppose to be waiting on a CT to be delivered for DS to compare to his CXR from 08/20/17. He also states that they aren't moving his CT up sooner than 08/29/17. Informed him of what DS' response is when I asked him what we are doing concerning a CT scan. Pt did ask me to apologize to the front that he has spoken with.   Advised by DS to inform assistant director Maryann Conners, about what is going on.

## 2017-08-21 NOTE — Telephone Encounter (Signed)
Written CT report placed in DS' folder.

## 2017-08-21 NOTE — Telephone Encounter (Signed)
Pt would like cxr results. Please call.

## 2017-08-21 NOTE — Telephone Encounter (Signed)
Pt is asking for CXR results.

## 2017-08-21 NOTE — Telephone Encounter (Signed)
CT scan received and placed in provider folder for review. Nothing further needed.

## 2017-08-21 NOTE — Telephone Encounter (Signed)
Pt calling very upset and cursing stating he is upset for we are to have already called him  he states he spoke to Dr Alva Garnet about CXR results and during that conversation he mentioned to provider that he had a CT done at [facility] in [place]  He was told by Dr Alva Garnet he states to have those faxed over to rule out cancer He states he should not be the one going back and fourth trying to make sure Dr Alva Garnet receives those notes. He states this is "damn right stupid" and he needs to know soon for in case this is cancerous.   Explained to him once we receive the notes we will call him, he said will that be this week or next week and then proceeded to hang up.    Please call once we receive CT results from [place] via fax     Please advise

## 2017-08-21 NOTE — Telephone Encounter (Signed)
[facility] is sending over last CT over, please call if not received.

## 2017-08-21 NOTE — Telephone Encounter (Signed)
Patient calling back to apologize, he is just frustrated with the back and forth with both hospitals Stated to patient we have received the CT Scan and it is waiting for Dr. Alva Garnet to review  Patient is pleased with information Please call when reviewed

## 2017-08-22 ENCOUNTER — Telehealth: Payer: Self-pay | Admitting: Pulmonary Disease

## 2017-08-22 NOTE — Telephone Encounter (Signed)
Please advise on message below.

## 2017-08-22 NOTE — Telephone Encounter (Signed)
I spoke with patient over phone

## 2017-08-22 NOTE — Telephone Encounter (Signed)
Pt calling stating he is wanting to know if we can please order a bag of fluid for him  He states he has a concert to go to and would like to get better before tomorrow night   He states he is very dehydrated and still sick He was hoping we could also send in some antibiotic for the cold  He states last time he had this feeling his PCP at [facility] in [place] called in the bag and sent in the medication but since we are closer he is asking if we can do it   Please advise

## 2017-12-04 IMAGING — MR MR HEAD WO/W CM
10 of 13 series · 35 of 48 positions shown · IV contrast (multihance)
Comparison: None.

CLINICAL DATA: Stage IV lung cancer. Metastatic disease to the
liver and bone.

EXAM:
MRI HEAD WITHOUT AND WITH CONTRAST
TECHNIQUE: Multiplanar, multiecho pulse sequences of the brain and surrounding
structures were obtained without and with intravenous contrast.
CONTRAST:  20mL MULTIHANCE GADOBENATE DIMEGLUMINE 529 MG/ML IV SOLN

[Series 3: T1 · sagittal · 5.0mm · 0.47mm/px · 2 of 24 slices shown]
[im 1/24]
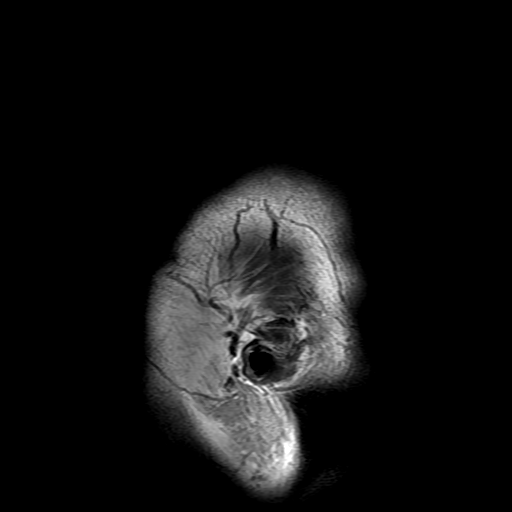
[im 12/24]
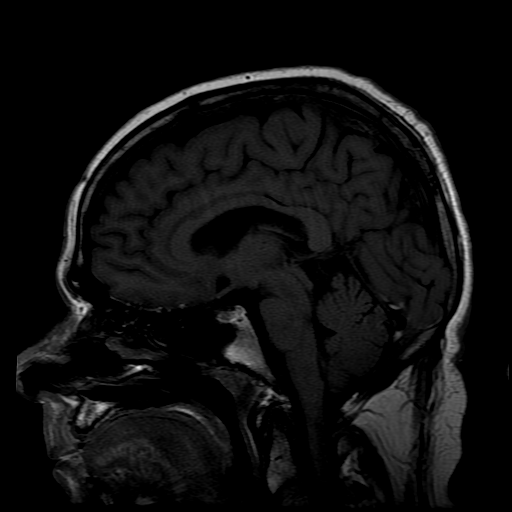

[Series 4: DWI · axial · 3.0mm · 1.09mm/px · z∈[-100,+62]mm · 9 of 112 slices shown (1 of 4)]
[im 1/112]
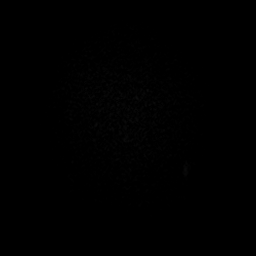
[im 14/112]
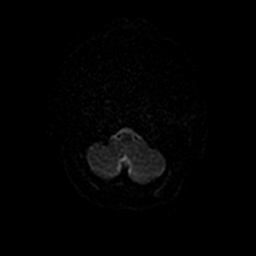
[im 28/112]
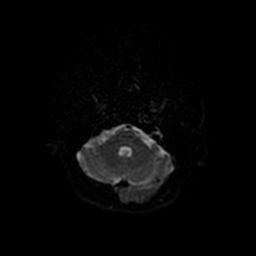
[im 42/112]
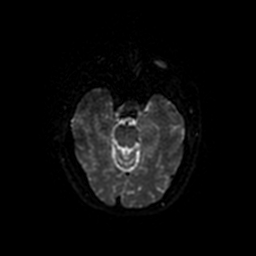
[im 56/112]
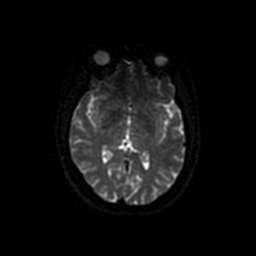
[im 70/112]
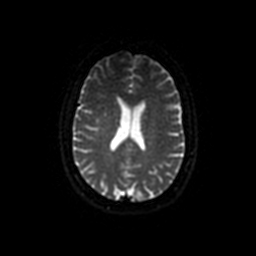
[im 84/112]
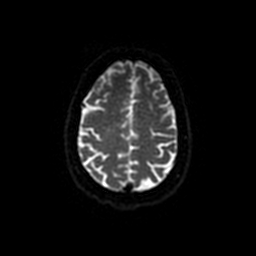
[im 98/112]
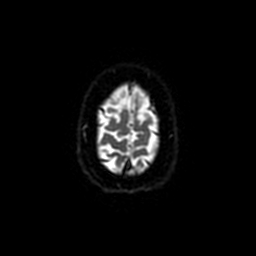
[im 112/112]
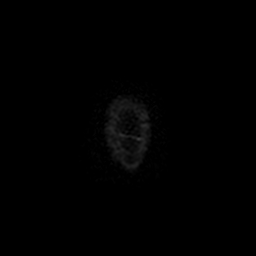

[Series 5: DWI · coronal · 5.0mm · 1.09mm/px · 6 of 74 slices shown (2 of 4)]
[im 1/74]
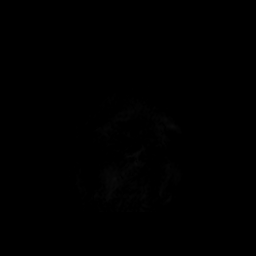
[im 15/74]
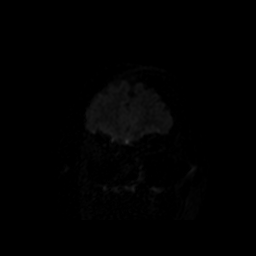
[im 30/74]
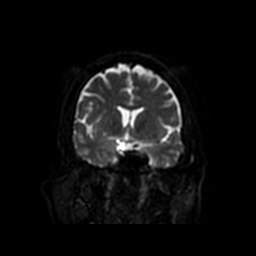
[im 44/74]
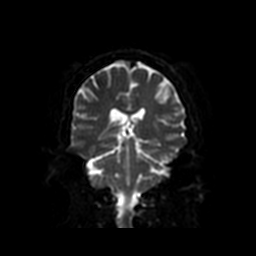
[im 59/74]
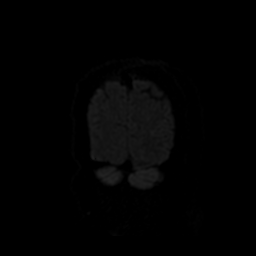
[im 74/74]
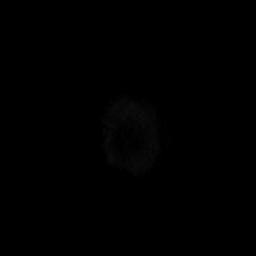

[Series 6: T2 · axial · 5.0mm · 0.43mm/px · z∈[-81,+80]mm · 2 of 26 slices shown]
[im 1/26]
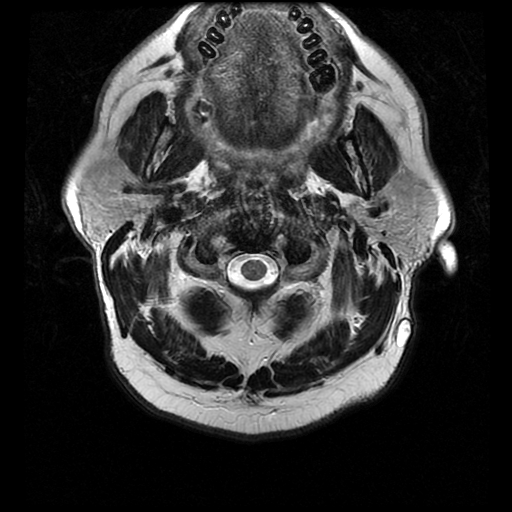
[im 26/26]
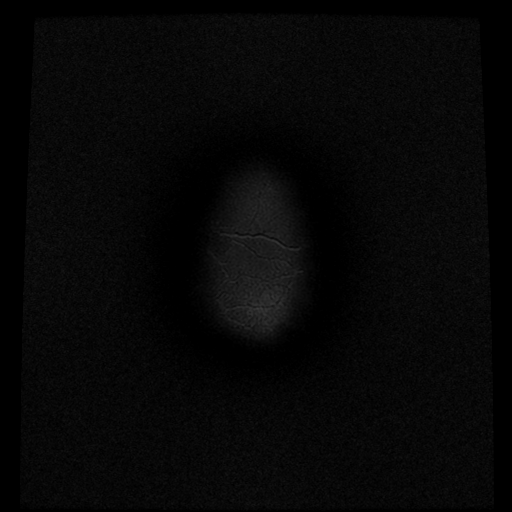

[Series 7: FLAIR · axial · 5.0mm · 0.43mm/px · z∈[-88,+86]mm · 2 of 26 slices shown]
[im 1/26]
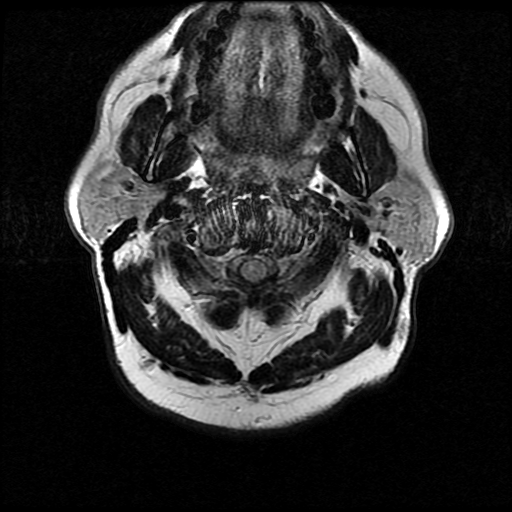
[im 26/26]
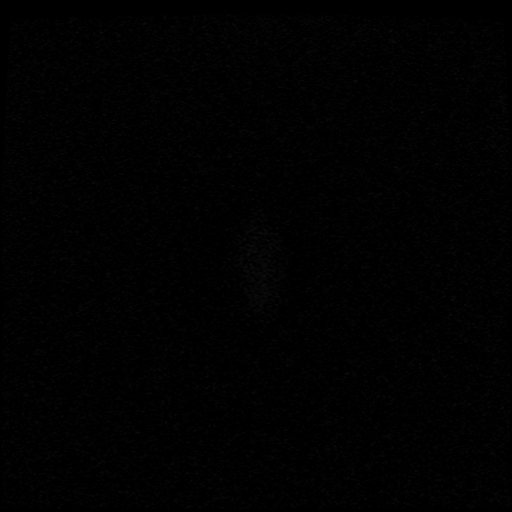

[Series 10: T2 post-contrast · coronal · 5.0mm · 0.45mm/px · 2 of 29 slices shown]
[im 1/29]
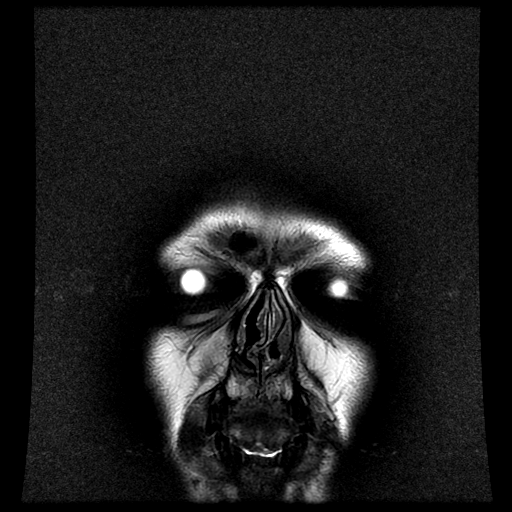
[im 29/29]
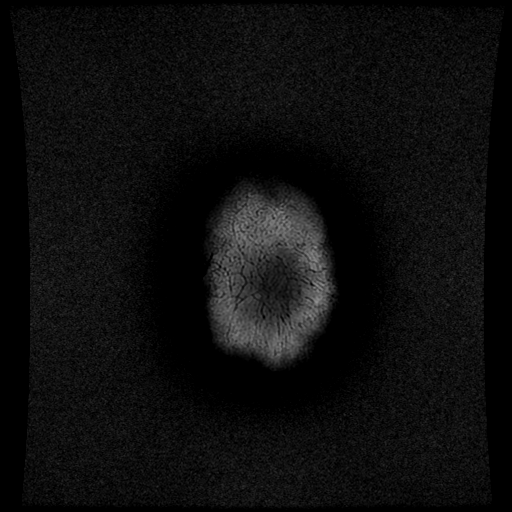

[Series 12: T1 post-contrast · coronal · 5.0mm · 0.45mm/px · 2 of 29 slices shown (1 of 2)]
[im 1/29]
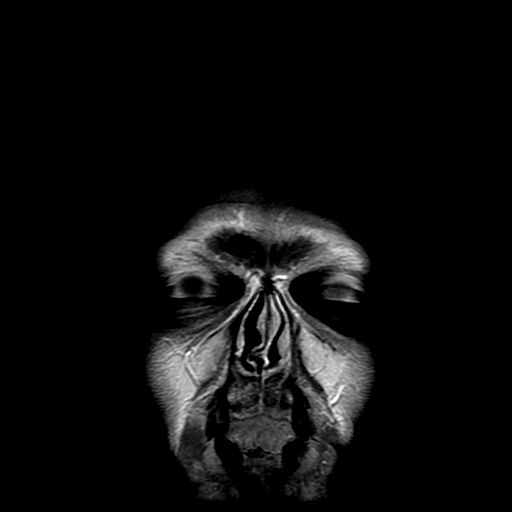
[im 29/29]
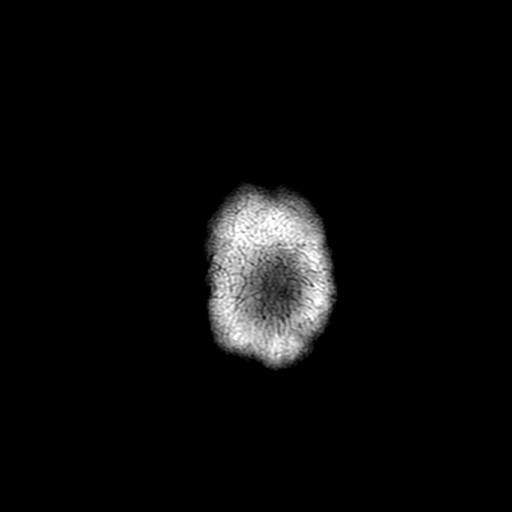

[Series 13: T1 post-contrast · sagittal · 5.0mm · 0.47mm/px · 2 of 24 slices shown (2 of 2)]
[im 1/24]
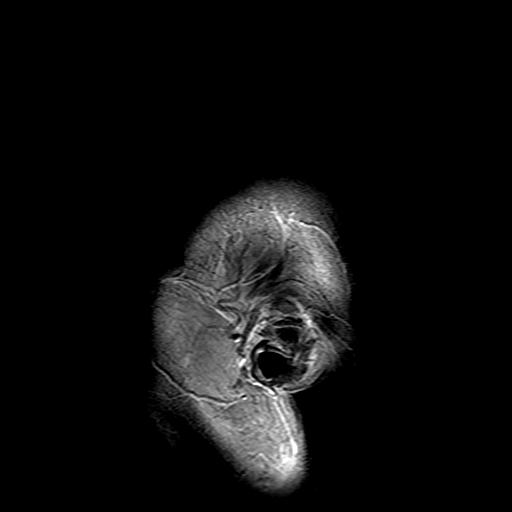
[im 24/24]
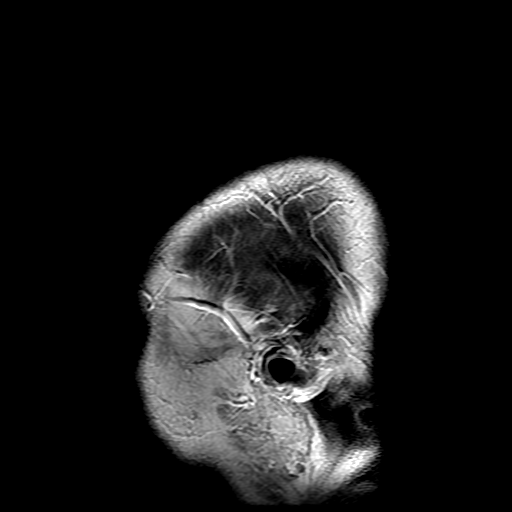

[Series 400: DWI · axial · 3.0mm · 1.09mm/px · z∈[-100,+62]mm · 5 of 56 slices shown (3 of 4)]
[im 1/56]
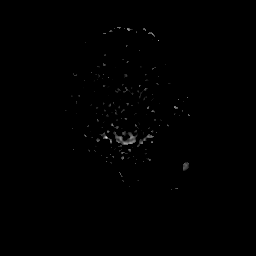
[im 14/56]
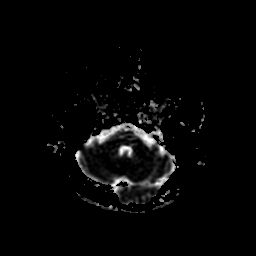
[im 28/56]
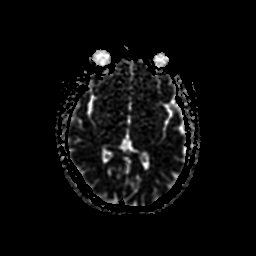
[im 42/56]
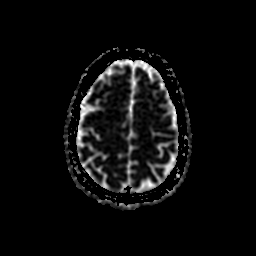
[im 56/56]
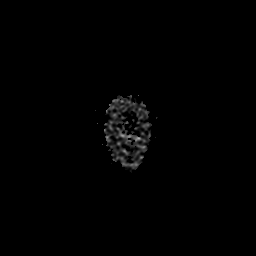

[Series 500: DWI · coronal · 5.0mm · 1.09mm/px · 3 of 36 slices shown (4 of 4)]
[im 1/36]
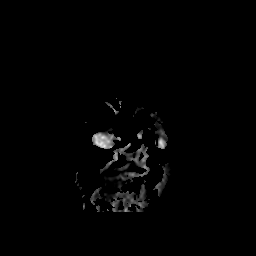
[im 18/36]
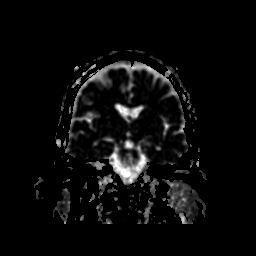
[im 36/36]
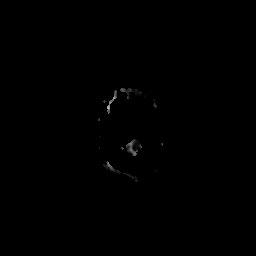

[35 of 48 positions shown; findings below may reference images not displayed]

FINDINGS: Mild subcortical T2 changes are present bilaterally. These are
slightly greater than expected for age. No acute infarct,
hemorrhage, or mass lesion is present.

The postcontrast images demonstrate no pathologic enhancement to
suggest metastatic disease of the brain or meninges.

The ventricles are of normal size. No significant extraaxial fluid
collection is present.

Internal auditory canals are within normal limits bilaterally. Flow
is present in the major intracranial arteries. The globes and orbits
are intact. Minimal mucosal thickening is present along the floor of
the maxillary sinuses bilaterally. Rightward nasal septal spurring
is present. The paranasal sinuses are otherwise clear.

Skullbase is within normal limits. Midline sagittal images are
unremarkable.
IMPRESSION: 1. No evidence for metastatic disease to the brain.
2. No acute intracranial abnormality.
3. Scattered subcortical white matter changes bilaterally are
slightly greater than expected for age. The finding is nonspecific
but can be seen in the setting of chronic microvascular ischemia, a
demyelinating process such as multiple sclerosis, vasculitis,
complicated migraine headaches, or as the sequelae of a prior
infectious or inflammatory process.

## 2017-12-12 ENCOUNTER — Telehealth: Payer: Self-pay | Admitting: Pulmonary Disease

## 2017-12-12 NOTE — Telephone Encounter (Signed)
Pt states 2 days ago he has awaken with his lungs hurting, he is coughing.Pt states he is also running a fever. Pt scheduled for Monday 9/16. He declined an appointment with Dr. Juanell Fairly or [facility] for tomorrow. Please call.

## 2017-12-12 NOTE — Telephone Encounter (Signed)
Spoke with pt and he states that he will come in on Monday. He doesn't want to see anyone else besides DS. Offered to send message to DR but pt states he will go elsewhere if he gets worse before Monday. Nothing further needed.

## 2017-12-17 ENCOUNTER — Telehealth: Payer: Self-pay | Admitting: Pulmonary Disease

## 2017-12-17 ENCOUNTER — Ambulatory Visit: Payer: Medicaid Other | Admitting: Pulmonary Disease

## 2017-12-17 NOTE — Telephone Encounter (Signed)
Per DS, CXR shows PNA and pt needs to complete Levaquin and come in next week to follow up.

## 2017-12-17 NOTE — Telephone Encounter (Signed)
Patient calling back he had one more question   The week before he was told he has pneumonia, he was told that he has a Staph infection. He was told that by Walk in at [facility]. He states someone told him that sometimes it can be miss read that you have pneumonia and it be the effects of the infection.  He would like to make sure we knew this.  Please call back

## 2017-12-17 NOTE — Telephone Encounter (Signed)
Please see last message below.

## 2017-12-17 NOTE — Telephone Encounter (Signed)
Pt informed and scheduled to see DS 9/26 @ 130pm. Nothing further needed.

## 2017-12-17 NOTE — Telephone Encounter (Signed)
Patient returning our call for Chest x ray results  Please call back

## 2017-12-27 ENCOUNTER — Ambulatory Visit: Payer: Medicaid Other | Admitting: Pulmonary Disease

## 2017-12-27 ENCOUNTER — Encounter: Payer: Self-pay | Admitting: Pulmonary Disease

## 2017-12-27 VITALS — BP 122/90 | HR 90 | Ht 71.0 in | Wt 194.0 lb

## 2017-12-27 DIAGNOSIS — R059 Cough, unspecified: Secondary | ICD-10-CM

## 2017-12-27 DIAGNOSIS — R918 Other nonspecific abnormal finding of lung field: Secondary | ICD-10-CM | POA: Diagnosis not present

## 2017-12-27 DIAGNOSIS — R079 Chest pain, unspecified: Secondary | ICD-10-CM

## 2017-12-27 DIAGNOSIS — F17200 Nicotine dependence, unspecified, uncomplicated: Secondary | ICD-10-CM

## 2017-12-27 DIAGNOSIS — C3490 Malignant neoplasm of unspecified part of unspecified bronchus or lung: Secondary | ICD-10-CM | POA: Diagnosis not present

## 2017-12-27 DIAGNOSIS — R05 Cough: Secondary | ICD-10-CM | POA: Diagnosis not present

## 2017-12-27 MED ORDER — DOXYCYCLINE HYCLATE 100 MG PO TABS: 100.0000 mg | ORAL_TABLET | Freq: Two times a day (BID) | ORAL | 0 refills | Status: AC

## 2017-12-27 NOTE — Patient Instructions (Addendum)
Doxycycline 100 mg twice a day for 7 days I will review results of CT chest scheduled for next Tues Call or follow up as needed

## 2018-01-01 NOTE — Progress Notes (Signed)
PROBLEMS: Small cell cancer of lung, extensive stage Smoker - continues to smoke 1/2 PPD COPD   INTERVAL: Last seen 08/20/17. Undergoing radiation and chemotherapy @ [facility]  SUBJ: Overall, he has done fairly well since last encounter in May of this year.  However, 2 weeks ago, he developed fever and increased shortness of breath with left-sided flank and chest pain.  A chest x-ray was performed and interpreted as demonstrating pneumonia.  He was placed on levofloxacin.  He follows up today stating that he is improved from 2 weeks ago.  He has no further fever.  He has minimal cough and no sputum production.  He denies hemoptysis.  He has no lower extremity edema or calf tenderness.  He continues to feel generally weak with mild exertional dyspnea, little day-to-day variation.  He continues to smoke approximately 1/2 pack cigarettes per day.  He is scheduled for CTA of chest 01/01/2018 to be performed at [facility].  Due to his persistent symptoms, he wonders whether he should be back on an antibiotic.  He states that he feels like he did prior to the onset of his recent "pneumonia".   OBJ: Vitals:   12/27/17 1319 12/27/17 1326  BP:  122/90  Pulse:  90  SpO2:  94%  Weight: 194 lb (88 kg)   Height: 5\' 11"  (1.803 m)   Room air  NAD HEENT WNL Neck supple without lymphadenopathy Chest: No wheezes, faint left basilar crackles Regular, no M NABS, soft, NT Extremities without clubbing, cyanosis, edema No focal neurologic deficits   DATA: CBC Latest Ref Rng & Units 06/20/2016 06/13/2016 11/08/2015  WBC 3.8 - 10.6 K/uL 14.6(H) 15.4(H) 8.2  Hemoglobin 13.0 - 18.0 g/dL 15.8 16.8 14.1  Hematocrit 40.0 - 52.0 % 47.0 49.0 40.9  Platelets 150 - 440 K/uL 185 282 213    BMP Latest Ref Rng & Units 10/20/2015 09/27/2015 09/06/2015  Glucose 65 - 99 mg/dL 190(H) 165(H) 156(H)  BUN 6 - 20 mg/dL 32(H) 20 22(H)  Creatinine 0.61 - 1.24 mg/dL 0.95 0.80 0.80  Sodium 135 - 145 mmol/L 133(L) 135 138  Potassium 3.5 -  5.1 mmol/L 4.0 4.0 4.1  Chloride 101 - 111 mmol/L 101 103 105  CO2 22 - 32 mmol/L 22 25 25   Calcium 8.9 - 10.3 mg/dL 9.2 9.2 9.6   CXR 9/12: Opacity on right has improved significantly.  There is still some suprahilar opacification.  There is also new opacification in LLL versus lingula  IMPRESSION: 1) recalcitrant smoker 2) metastatic small cell carcinoma of lung 3) COPD without active wheezing 4) evanescent pulmonary opacities.  Infiltrate in RLL much improved since May.  New infiltrates on the left 5) chronic cough 6) left-sided chest discomfort.  Musculoskeletal in nature  PLAN: I again counseled regarding the potential benefits of smoking cessation Doxycycline 100 mg twice a day for 7 days I will review results of CT chest scheduled for next Tues Call or follow up as needed  Merton Border, MD PCCM service Mobile [phone] Pager [phone] 01/01/2018 2:39 PM

## 2018-06-17 ENCOUNTER — Other Ambulatory Visit: Payer: Self-pay

## 2018-06-17 ENCOUNTER — Emergency Department
Admission: EM | Admit: 2018-06-17 | Discharge: 2018-06-17 | Disposition: A | Discharge status: Home / Self Care | Payer: Medicaid Other | Attending: Emergency Medicine | Admitting: Emergency Medicine

## 2018-06-17 ENCOUNTER — Encounter: Payer: Self-pay | Admitting: *Deleted

## 2018-06-17 DIAGNOSIS — H5789 Other specified disorders of eye and adnexa: Secondary | ICD-10-CM | POA: Insufficient documentation

## 2018-06-17 DIAGNOSIS — Z79899 Other long term (current) drug therapy: Secondary | ICD-10-CM | POA: Insufficient documentation

## 2018-06-17 DIAGNOSIS — Z85118 Personal history of other malignant neoplasm of bronchus and lung: Secondary | ICD-10-CM | POA: Insufficient documentation

## 2018-06-17 DIAGNOSIS — R739 Hyperglycemia, unspecified: Secondary | ICD-10-CM | POA: Insufficient documentation

## 2018-06-17 DIAGNOSIS — F17210 Nicotine dependence, cigarettes, uncomplicated: Secondary | ICD-10-CM | POA: Insufficient documentation

## 2018-06-17 DIAGNOSIS — I10 Essential (primary) hypertension: Secondary | ICD-10-CM | POA: Diagnosis not present

## 2018-06-17 LAB — URINALYSIS, COMPLETE (UACMP) WITH MICROSCOPIC
BACTERIA UA: NONE SEEN
Bilirubin Urine: NEGATIVE
Hgb urine dipstick: NEGATIVE
Ketones, ur: NEGATIVE mg/dL
Leukocytes,Ua: NEGATIVE
Nitrite: NEGATIVE
Protein, ur: NEGATIVE mg/dL
Specific Gravity, Urine: 1.035 — ABNORMAL HIGH (ref 1.005–1.030)
pH: 6 (ref 5.0–8.0)

## 2018-06-17 LAB — CBC
HCT: 50.3 % (ref 39.0–52.0)
Hemoglobin: 17 g/dL (ref 13.0–17.0)
MCH: 29.5 pg (ref 26.0–34.0)
MCHC: 33.8 g/dL (ref 30.0–36.0)
MCV: 87.3 fL (ref 80.0–100.0)
PLATELETS: 189 10*3/uL (ref 150–400)
RBC: 5.76 MIL/uL (ref 4.22–5.81)
RDW: 13.6 % (ref 11.5–15.5)
WBC: 17 10*3/uL — ABNORMAL HIGH (ref 4.0–10.5)
nRBC: 0 % (ref 0.0–0.2)

## 2018-06-17 LAB — BASIC METABOLIC PANEL
Anion gap: 12 (ref 5–15)
BUN: 28 mg/dL — ABNORMAL HIGH (ref 6–20)
CALCIUM: 9.3 mg/dL (ref 8.9–10.3)
CO2: 24 mmol/L (ref 22–32)
CREATININE: 0.71 mg/dL (ref 0.61–1.24)
Chloride: 97 mmol/L — ABNORMAL LOW (ref 98–111)
GLUCOSE: 454 mg/dL — AB (ref 70–99)
Potassium: 4.7 mmol/L (ref 3.5–5.1)
Sodium: 133 mmol/L — ABNORMAL LOW (ref 135–145)

## 2018-06-17 LAB — GLUCOSE, CAPILLARY
GLUCOSE-CAPILLARY: 492 mg/dL — AB (ref 70–99)
Glucose-Capillary: 322 mg/dL — ABNORMAL HIGH (ref 70–99)
Glucose-Capillary: 323 mg/dL — ABNORMAL HIGH (ref 70–99)

## 2018-06-17 LAB — TROPONIN I: Troponin I: <0.03 ng/mL (ref <0.03)

## 2018-06-17 MED ORDER — IBUPROFEN 600 MG PO TABS
600.0000 mg | ORAL_TABLET | Freq: Once | ORAL | Status: AC
  Administered 2018-06-17: 600 mg via ORAL
  Filled 2018-06-17: qty 1

## 2018-06-17 MED ORDER — SODIUM CHLORIDE 0.9 % IV BOLUS
1000.0000 mL | Freq: Once | INTRAVENOUS | Status: AC
  Administered 2018-06-17: 1000 mL via INTRAVENOUS

## 2018-06-17 NOTE — ED Notes (Signed)
BG 322.

## 2018-06-17 NOTE — ED Notes (Signed)
Explained to pt/family that pt cannot have food rn d/t high BG. Pt displeased.

## 2018-06-17 NOTE — ED Provider Notes (Signed)
[facility] Emergency Department Provider Note  ____________________________________________   I have reviewed the triage vital signs and the nursing notes.   HISTORY  Chief Complaint Hyperglycemia   History limited by: Not Limited   HPI Chris Mason is a 58 y.o. male who presents to the emergency department today because of concern for high blood sugar. 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 He states that when he has been on steroids in the past his sugars have been elevated. He did have some change in his vision this time which prompted him to check his sugars. He denies any history of diabetes however family states that he has been put on metformin in the past but it does sound like that was secondary to steroid use again. The patient has secondary complaint of chest pain. Located in the center chest. Has been present for the past 3 weeks. Worse when he tries to sit up or look down.    Per medical record review patient has a history of HTN, cancer.   Past Medical History:  Diagnosis Date  . Cancer ([place])    right lung mass, left pubic bone lesion, hepatic mass  . Hypertension   . Hyperthyroidism     Patient Active Problem List   Diagnosis Date Noted  . Dental abscess 07/13/2015  . 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 Anxiety 06/11/2015  . Liver metastases ([place]) 06/09/2015  . 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 ELBOW ARTHROPLASTY    . PERIPHERAL VASCULAR CATHETERIZATION N/A 07/08/2015   Procedure: Glori Luis Cath Insertion;  Surgeon: Algernon Huxley, MD;  Location: [facility];  Service: Cardiovascular;  Laterality: N/A;    Prior to Admission medications   Medication Sig Start Date End Date Taking? Authorizing Provider  albuterol (PROVENTIL HFA;VENTOLIN HFA) 108 (90 Base) MCG/ACT inhaler  Inhale 2 puffs into the lungs every 6 (six) hours as needed for wheezing or shortness of breath. 02/07/16   [name], MD  doxycycline (VIBRA-TABS) 100 MG tablet Take 1 tablet (100 mg total) by mouth 2 (two) times daily. 12/27/17   [name], MD  Fluticasone-Umeclidin-Vilant (TRELEGY ELLIPTA) 100-62.5-25 MCG/INH AEPB Inhale 1 puff into the lungs daily. 08/20/17   [name], MD  Oxycodone HCl 10 MG TABS Take 1 tablet (10 mg total) by mouth every 6 (six) hours as needed for severe pain. Patient taking differently: Take 5 mg by mouth every 6 (six) hours as needed for severe pain.  06/14/16   [name], MD  umeclidinium-vilanterol (ANORO ELLIPTA) 62.5-25 MCG/INH AEPB Inhale 1 puff into the lungs daily. 08/20/17   [name], MD    Allergies Patient has no known allergies.  Family History  Problem Relation Age of Onset  . Hypertension Mother   . Cancer Paternal Aunt        Breast     Social History Social History   Tobacco Use  . Smoking status: Current Every Day Smoker    Packs/day: 1.00    Years: 35.00    Pack years: 35.00    Types: Cigarettes  . Smokeless tobacco: Never Used  Substance Use Topics  . Alcohol use: No  . Drug use: No    Review of Systems Constitutional: No fever/chills Eyes: Positive for blurry vision.  ENT: No sore throat. Cardiovascular: Positive  for chest pain. Respiratory: Denies shortness of breath. Gastrointestinal: No abdominal pain.  No nausea, no vomiting.  No diarrhea.   Genitourinary: Negative for dysuria. Musculoskeletal: Negative for back pain. Skin: Negative for rash. Neurological: Negative for headaches, focal weakness or numbness.  ____________________________________________   PHYSICAL EXAM:  VITAL SIGNS: ED Triage Vitals  Enc Vitals Group     BP 06/17/18 1417 (!) 130/94     Pulse Rate 06/17/18 1417 85     Resp 06/17/18 1417 18     Temp 06/17/18 1417 98.1 F (36.7 C)     Temp Source 06/17/18 1417 Oral      SpO2 06/17/18 1417 97 %     Weight 06/17/18 1417 207 lb (93.9 kg)     Height 06/17/18 1417 5\' 10"  (1.778 m)     Head Circumference --      Peak Flow --      Pain Score 06/17/18 1422 8   Constitutional: Alert and oriented.  Eyes: Conjunctivae are normal.  ENT      Head: Normocephalic and atraumatic.      Nose: No congestion/rhinnorhea.      Mouth/Throat: Mucous membranes are moist.      Neck: No stridor. Hematological/Lymphatic/Immunilogical: No cervical lymphadenopathy. Cardiovascular: Normal rate, regular rhythm.  No murmurs, rubs, or gallops. Respiratory: Normal respiratory effort without tachypnea nor retractions. Breath sounds are clear and equal bilaterally. No wheezes/rales/rhonchi. Gastrointestinal: Soft and non tender. No rebound. No guarding.  Genitourinary: Deferred Musculoskeletal: Normal range of motion in all extremities. No lower extremity edema. Neurologic:  Normal speech and language. No gross focal neurologic deficits are appreciated.  Skin:  Skin is warm, dry and intact. No rash noted. Psychiatric: Mood and affect are normal. Speech and behavior are normal. Patient exhibits appropriate insight and judgment.  ____________________________________________    LABS (pertinent positives/negatives)  CBC wbc 17.0, hgb 17.0, plt 189 BMP na 133, k 4.7, glu 454, cr 0.71  ____________________________________________   EKG  I, Nance Pear, attending physician, personally viewed and interpreted this EKG  EKG Time: 1423 Rate: 84 Rhythm: normal sinus rhythm Axis: left axis deviation Intervals: qtc 411 QRS: incomplete RBBB ST changes: no st elevation Impression: abnormal ekg  ____________________________________________    RADIOLOGY  None  ____________________________________________   PROCEDURES  Procedures  ____________________________________________   INITIAL IMPRESSION / ASSESSMENT AND PLAN / ED COURSE  Pertinent labs & imaging results  that were available during my care of the patient were reviewed by me and considered in my medical decision making (see chart for details).   Patient presented to the emergency department today because of concern for elevated blood sugar in the setting of taking steroids. The patient was found to have elevated blood sugar here. However no concerning signs of DKA. Blood sugar did improve with iv fluids. Discussed with patient that he should follow up with pcp about need for possible controlling medication. Did discuss with patient importance of diet and family states they have information about diet to help control sugars.   ____________________________________________   FINAL CLINICAL IMPRESSION(S) / ED DIAGNOSES  Final diagnoses:  Hyperglycemia     Note: This dictation was prepared with Dragon dictation. Any transcriptional errors that result from this process are unintentional     Nance Pear, MD 06/17/18 [phone]

## 2018-06-17 NOTE — ED Notes (Signed)
[place] frozen; pt signed printed paperwork.

## 2018-06-17 NOTE — ED Notes (Signed)
Pt started yelling at this RN when gown changed after pt urinated on self. Pt then started apologizing. Family remains at bedside.

## 2018-06-17 NOTE — ED Triage Notes (Signed)
Pt is here due to hyperglycemia. Pt states that he has been on steroids due to brain cancer and he reports that he is not diabetic but that he has had elevated blood sugar in the past related to being on steroids.  Pt also reports chest pain x3 weeks (he is waiting for a CT scan to evaluate this). Pt is alert and oriented and in no acute distress.

## 2018-06-17 NOTE — ED Notes (Signed)
First nurse note: Pt had brain surgery a few months ago, is on steroids to decrease swelling in brain, causing his blood sugar to rise. Has been in the 400-500's. NAD at this time.

## 2018-06-17 NOTE — Discharge Instructions (Addendum)
Please seek medical attention for any high fevers, chest pain, shortness of breath, change in behavior, persistent vomiting, bloody stool or any other new or concerning symptoms.  

## 2018-06-17 NOTE — ED Notes (Signed)
Pt steady when up to bedside toilet to provide urine sample.

## 2018-06-17 NOTE — ED Notes (Signed)
Pt repositioned in bed. Remains uncomfortable d/t "swelling in neck/head from the tumor" per pt/family.

## 2018-06-17 NOTE — ED Notes (Signed)
Pt given warm blankets. Requesting meds for HA; EDP aware.

## 2018-06-17 NOTE — ED Notes (Signed)
Pt denies s/s of hyperglycemia. Pt at neuro baseline per family who states he has brain cancer. A&Ox4 currently.

## 2018-06-25 ENCOUNTER — Encounter: Payer: Self-pay | Admitting: Emergency Medicine

## 2018-06-25 ENCOUNTER — Emergency Department: Payer: Medicaid Other

## 2018-06-25 ENCOUNTER — Emergency Department
Admission: EM | Admit: 2018-06-25 | Discharge: 2018-06-25 | Disposition: A | Discharge status: Home / Self Care | Payer: Medicaid Other | Attending: Emergency Medicine | Admitting: Emergency Medicine

## 2018-06-25 ENCOUNTER — Other Ambulatory Visit: Payer: Self-pay

## 2018-06-25 DIAGNOSIS — Z79899 Other long term (current) drug therapy: Secondary | ICD-10-CM | POA: Insufficient documentation

## 2018-06-25 DIAGNOSIS — I10 Essential (primary) hypertension: Secondary | ICD-10-CM | POA: Diagnosis not present

## 2018-06-25 DIAGNOSIS — C719 Malignant neoplasm of brain, unspecified: Secondary | ICD-10-CM | POA: Insufficient documentation

## 2018-06-25 DIAGNOSIS — R569 Unspecified convulsions: Secondary | ICD-10-CM | POA: Diagnosis not present

## 2018-06-25 DIAGNOSIS — C3490 Malignant neoplasm of unspecified part of unspecified bronchus or lung: Secondary | ICD-10-CM | POA: Insufficient documentation

## 2018-06-25 DIAGNOSIS — G9389 Other specified disorders of brain: Secondary | ICD-10-CM

## 2018-06-25 DIAGNOSIS — F17210 Nicotine dependence, cigarettes, uncomplicated: Secondary | ICD-10-CM | POA: Insufficient documentation

## 2018-06-25 LAB — COMPREHENSIVE METABOLIC PANEL
ALT: 43 U/L (ref 0–44)
AST: 18 U/L (ref 15–41)
Albumin: 3.5 g/dL (ref 3.5–5.0)
Alkaline Phosphatase: 80 U/L (ref 38–126)
Anion gap: 12 (ref 5–15)
BUN: 37 mg/dL — ABNORMAL HIGH (ref 6–20)
CALCIUM: 9.4 mg/dL (ref 8.9–10.3)
CO2: 22 mmol/L (ref 22–32)
Chloride: 106 mmol/L (ref 98–111)
Creatinine, Ser: 0.95 mg/dL (ref 0.61–1.24)
GFR calc Af Amer: >60 mL/min (ref >60)
GFR calc non Af Amer: >60 mL/min (ref >60)
Glucose, Bld: 346 mg/dL — ABNORMAL HIGH (ref 70–99)
Potassium: 3.8 mmol/L (ref 3.5–5.1)
Sodium: 140 mmol/L (ref 135–145)
Total Bilirubin: 1.4 mg/dL — ABNORMAL HIGH (ref 0.3–1.2)
Total Protein: 7 g/dL (ref 6.5–8.1)

## 2018-06-25 LAB — CBC WITH DIFFERENTIAL/PLATELET
ABS IMMATURE GRANULOCYTES: 0.35 10*3/uL — AB (ref 0.00–0.07)
BASOS PCT: 1 %
Basophils Absolute: 0.1 10*3/uL (ref 0.0–0.1)
Eosinophils Absolute: 0 10*3/uL (ref 0.0–0.5)
Eosinophils Relative: 0 %
HCT: 50.4 % (ref 39.0–52.0)
Hemoglobin: 17.3 g/dL — ABNORMAL HIGH (ref 13.0–17.0)
Immature Granulocytes: 3 %
Lymphocytes Relative: 7 %
Lymphs Abs: 0.8 10*3/uL (ref 0.7–4.0)
MCH: 29.3 pg (ref 26.0–34.0)
MCHC: 34.3 g/dL (ref 30.0–36.0)
MCV: 85.3 fL (ref 80.0–100.0)
Monocytes Absolute: 1 10*3/uL (ref 0.1–1.0)
Monocytes Relative: 8 %
Neutro Abs: 9.6 10*3/uL — ABNORMAL HIGH (ref 1.7–7.7)
Neutrophils Relative %: 81 %
Platelets: 235 10*3/uL (ref 150–400)
RBC: 5.91 MIL/uL — ABNORMAL HIGH (ref 4.22–5.81)
RDW: 12.9 % (ref 11.5–15.5)
WBC: 11.9 10*3/uL — ABNORMAL HIGH (ref 4.0–10.5)
nRBC: 0 % (ref 0.0–0.2)

## 2018-06-25 MED ORDER — DEXAMETHASONE SODIUM PHOSPHATE 10 MG/ML IJ SOLN
10.0000 mg | Freq: Once | INTRAMUSCULAR | Status: AC
  Administered 2018-06-25: 10 mg via INTRAVENOUS
  Filled 2018-06-25: qty 1

## 2018-06-25 NOTE — TOC Transition Note (Signed)
Transition of Care [facility]) - CM/SW Discharge Note   Patient Details  Name: MERRIL NAGY MRN: [id number] Date of Birth: 09/18/1960  Transition of Care [facility]) CM/SW Contact:  Shelbie Hutching, RN Phone Number: 06/25/2018, 2:08 PM   Clinical Narrative:     Patient will be discharged home with hospice.  Mother chooses [facility].  Flo Shanks hospice liaison notified of choice and she has reach out and spoken with the patient's mother.  Patient to go home via [facility].   Final next level of care: Home w Hospice Care Barriers to Discharge: No Barriers Identified   Patient Goals and CMS Choice Patient states their goals for this hospitalization and ongoing recovery are:: Patient's mother is HCPOA and she wants the patient home  CMS Medicare.gov Compare Post Acute Care list provided to:: Patient Represenative (must comment)(Deidra (mother)) Choice offered to / list presented to : [facility] POA / [facility]  Discharge Placement                    Patient and family notified of of transfer: 06/25/18  Discharge Plan and Services   Discharge Planning Services: CM Consult                      Social Determinants of Health (SDOH) Interventions     Readmission Risk Interventions No flowsheet data found.

## 2018-06-25 NOTE — Progress Notes (Signed)
New referral for [place] hospice services at home received from [facility]. Patient is a 58 year old man with known stage IV lung cancer with brain mets. He came from home via EMS for evaluation of seizures. EDP Dr. Corky Downs spoke with patient's oncologist in [place], she has spoken with patient's  mother, who has chosen to have home return home with the support of hospice services. Writer spoke via telephone to patient's mother Garry Heater (name incorrect on face sheet). She confirmed that there are no further treatment plans for Yamir and his oncologist told her it was time for hospice. Hospice services discussed, Dedra requested a hospital bed. She has had other family members with hospice and is familiar with the benefit. She confirmed the plan was for discharge home via EMS and that the bed did not need to be in place prior to discharge. Patient information  Faxed to referral. CMRN [place] updated. Thank you. Flo Shanks BSN, RN, [facility] [phone]

## 2018-06-25 NOTE — ED Notes (Signed)
[place]  MD

## 2018-06-25 NOTE — ED Triage Notes (Signed)
Pt ems from home for possible seizure. Per ems pts son stated that pt became unresponsive and started shaking all over. Pt unsure what happened and very sleepy here. Per ems pt with stage 4 Lung Ca with mets to brain. CBG 244

## 2018-06-25 NOTE — ED Provider Notes (Signed)
[facility] Emergency Department Provider Note   ____________________________________________    I have reviewed the triage vital signs and the nursing notes.   HISTORY  Chief Complaint Seizures   Patient unable to provide significant history  HPI Chris Mason is a 58 y.o. male with reported history of lung cancer with multiple mets.  Apparently the patient had a seizure today witnessed by family.  Unclear if he has had seizures before.  Review of medical records demonstrates the patient has been treated at [facility] hematology and treatment at this time has become mostly palliative given known brain metastases with edema  Past Medical History:  Diagnosis Date  . Cancer ([place])    right lung mass, left pubic bone lesion, hepatic mass  . Hypertension   . Hyperthyroidism     Patient Active Problem List   Diagnosis Date Noted  . Dental abscess 07/13/2015  . 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 Anxiety 06/11/2015  . Liver metastases ([place]) 06/09/2015  . 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 ELBOW ARTHROPLASTY    . PERIPHERAL VASCULAR CATHETERIZATION N/A 07/08/2015   Procedure: Glori Luis Cath Insertion;  Surgeon: Algernon Huxley, MD;  Location: [facility];  Service: Cardiovascular;  Laterality: N/A;    Prior to Admission medications   Medication Sig Start Date End Date Taking? Authorizing Provider  albuterol (PROVENTIL HFA;VENTOLIN HFA) 108 (90 Base) MCG/ACT inhaler Inhale 2 puffs into the lungs every 6 (six) hours as needed for wheezing or shortness of breath. 02/07/16   [name], MD  doxycycline (VIBRA-TABS) 100 MG tablet Take 1 tablet (100 mg total) by mouth 2 (two) times daily. 12/27/17   [name], MD  Fluticasone-Umeclidin-Vilant (TRELEGY ELLIPTA) 100-62.5-25 MCG/INH AEPB Inhale 1 puff into the lungs daily.  08/20/17   [name], MD  Oxycodone HCl 10 MG TABS Take 1 tablet (10 mg total) by mouth every 6 (six) hours as needed for severe pain. Patient taking differently: Take 5 mg by mouth every 6 (six) hours as needed for severe pain.  06/14/16   [name], MD  umeclidinium-vilanterol (ANORO ELLIPTA) 62.5-25 MCG/INH AEPB Inhale 1 puff into the lungs daily. 08/20/17   [name], MD     Allergies Patient has no known allergies.  Family History  Problem Relation Age of Onset  . Hypertension Mother   . Cancer Paternal Aunt        Breast     Social History Social History   Tobacco Use  . Smoking status: Current Every Day Smoker    Packs/day: 1.00    Years: 35.00    Pack years: 35.00    Types: Cigarettes  . Smokeless tobacco: Never Used  Substance Use Topics  . Alcohol use: No  . Drug use: No    Level 5 caveat: Unable to obtain review of Systems due to postictal state     ____________________________________________   PHYSICAL EXAM:  VITAL SIGNS: ED Triage Vitals  Enc Vitals Group     BP --      Pulse Rate 06/25/18 1045 94     Resp --      Temp 06/25/18 1045 98.5 F (36.9 C)     Temp Source 06/25/18 1045 Oral     SpO2 06/25/18 1045 92 %     Weight 06/25/18 1047 91 kg (  200 lb 9.9 oz)     Height 06/25/18 1047 1.778 m (5\' 10" )     Head Circumference --      Peak Flow --      Pain Score 06/25/18 1046 Asleep     Pain Loc --      Pain Edu? --      Excl. in [place]? --     Constitutional: Arousable Eyes: PERRLA  Nose: No congestion/rhinnorhea. Mouth/Throat: Mucous membranes are moist.    Cardiovascular: Normal rate, regular rhythm. Grossly normal heart sounds.  Good peripheral circulation. Respiratory: Normal respiratory effort.  No retractions. Lungs CTAB. Gastrointestinal: Soft and nontender. No distention  Musculoskeletal: Warm and well perfused Neurologic: Arousable and does answer questions appropriately, moves all extremities, PERRLA Skin:  Skin  is warm, dry and intact. No rash noted.   ____________________________________________   LABS (all labs ordered are listed, but only abnormal results are displayed)  Labs Reviewed  CBC WITH DIFFERENTIAL/PLATELET - Abnormal; Notable for the following components:      Result Value   WBC 11.9 (*)    RBC 5.91 (*)    Hemoglobin 17.3 (*)    Neutro Abs 9.6 (*)    Abs Immature Granulocytes 0.35 (*)    All other components within normal limits  COMPREHENSIVE METABOLIC PANEL - Abnormal; Notable for the following components:   Glucose, Bld 346 (*)    BUN 37 (*)    Total Bilirubin 1.4 (*)    All other components within normal limits  URINE DRUG SCREEN, QUALITATIVE ([facility] ONLY)  URINALYSIS, COMPLETE ([facility]) WITH MICROSCOPIC  ETHANOL   ____________________________________________  EKG  None ____________________________________________  RADIOLOGY  CT demonstrates large hyperdense mass right frontal lobe with edema and 12.5 mm of midline shift to the left ____________________________________________   PROCEDURES  Procedure(s) performed: No  Procedures   Critical Care performed: No ____________________________________________   INITIAL IMPRESSION / ASSESSMENT AND PLAN / ED COURSE  Pertinent labs & imaging results that were available during my care of the patient were reviewed by me and considered in my medical decision making (see chart for details).  Patient presents with seizure, known brain metastases, will send for CT head, seizure precautions, send labs, and start IV, cardiac monitor  Called by radiologist notified of large brain mass with 12.5 mm of shift.  ----------------------------------------- 11:59 AM on 06/25/2018 -----------------------------------------  Discussed with Dr. Kinnie Scales of hematology oncology at [facility], she cares for this patient primarily and she has been in contact with his mother who is medical power of attorney who has requested hospice  I have  consulted palliative care/hospice  Healthcare power of attorney has opted for home hospice, this has been arranged, appropriate for discharge at this time    ____________________________________________   FINAL CLINICAL IMPRESSION(S) / ED DIAGNOSES  Final diagnoses:  Seizure ([place])  Brain mass        Note:  This document was prepared using Dragon voice recognition software and may include unintentional dictation errors.   Lavonia Drafts, MD 06/25/18 1349

## 2018-06-25 NOTE — ED Notes (Signed)
Pt awake, asking where he was. Place and reason for being at [facility] explained to pt. Pt informed that he was waiting for ems to pick him up and take him home. Pt asked for and received grape juice. Call bell within reach, bed in low position, seizure pads in place.

## 2018-06-25 NOTE — TOC Initial Note (Signed)
Transition of Care [facility]) - Initial/Assessment Note    Patient Details  Name: Chris Mason MRN: [id number] Date of Birth: 10/15/1960  [facility]) CM/SW Contact:    Shelbie Hutching, RN Phone Number: 06/25/2018, 12:30 PM  Clinical Narrative:                 Patient is being seen in the ED after a seizure.  Patient has stage 4 lung cancer with brain mets.  The patient's mother Margaretann Loveless is the patient's HCPOA.  RNCM spoke with the patient's mother about goals of care and hospice.  Mother has not decided yet on hospice she would like to speak with the patient's children and his oncology doctor before deciding.  Patient's mother will call RNCM back by 2pm with a decision.  Choice of agency also offered if hospice is chosen.  Patient's son has been staying with him since he has been sick and the patient's mother also reports she stays with him as well.  RNCM will cont to follow.   Expected Discharge Plan: Home w Hospice Care(or hospice medical facility) Barriers to Discharge: Other (comment)(family deciding on plan of care)   Patient Goals and CMS Choice        Expected Discharge Plan and Services Expected Discharge Plan: Home w Hospice Care(or hospice medical facility)   Discharge Planning Services: CM Consult   Living arrangements for the past 2 months: Single Family Home                          Prior Living Arrangements/Services Living arrangements for the past 2 months: Single Family Home Lives with:: Adult Children(son ) Patient language and need for interpreter reviewed:: No        Need for Family Participation in Patient Care: Yes (Comment)(end of life decisions mother and son) Care giver support system in place?: Yes (comment)(mother and patient's children)   Criminal Activity/Legal Involvement Pertinent to Current Situation/Hospitalization: No - Comment as needed  Activities of Daily Living      Permission Sought/Granted Permission sought to share  information with : Family Supports Permission granted to share information with : Yes, Verbal Permission Granted     Permission granted to share info w AGENCY: Hospice agency of choice  Permission granted to share info w Relationship: Mother      Emotional Assessment Appearance:: Appears stated age Attitude/Demeanor/Rapport: (Lung Ca with brain mets - post seizure)     Alcohol / Substance Use: Not Applicable Psych Involvement: No (comment)  Admission diagnosis:  seizure Patient Active Problem List   Diagnosis Date Noted  . Dental abscess 07/13/2015  . 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 Anxiety 06/11/2015  . Liver metastases ([place]) 06/09/2015  . Malignant neoplasm of pubic bone ([facility]) 06/04/2015   PCP:  Quita Skye, MD Pharmacy:   [facility], [address] [place] [address] Phone: [phone] Fax: [phone]  [id number] [place], [place] - [address] [place] [address] Phone: [phone] Fax: [phone]     Social Determinants of Health (SDOH) Interventions    Readmission Risk Interventions No flowsheet data found.

## 2018-06-25 NOTE — ED Notes (Signed)
Spoke with patient's mother who is POA. She confirmed that patient should be discharged home via EMS where they are working on setting up home hospice. Will call again before patient leaves [facility]

## 2018-06-25 NOTE — ED Notes (Signed)
Attempted to reach patient's POA via phone to inform her of patient's pending discharge, will attempt again

## 2018-08-02 DEATH — deceased
